# Patient Record
Sex: Male | Born: 1950
Health system: Southern US, Community
[De-identification: ages and names within clinical notes are randomized; demographics above are authoritative.]

## PROBLEM LIST (undated history)

## (undated) DIAGNOSIS — I714 Abdominal aortic aneurysm, without rupture, unspecified: Secondary | ICD-10-CM

## (undated) HISTORY — PX: FOOT SURGERY: SHX648

## (undated) HISTORY — DX: Abdominal aortic aneurysm, without rupture: I71.4

## (undated) HISTORY — DX: Abdominal aortic aneurysm, without rupture, unspecified: I71.40

## (undated) HISTORY — PX: NOSE SURGERY: SHX723

## (undated) HISTORY — PX: KNEE SURGERY: SHX244

---

## 2012-03-20 DIAGNOSIS — I10 Essential (primary) hypertension: Secondary | ICD-10-CM

## 2012-03-20 DIAGNOSIS — E785 Hyperlipidemia, unspecified: Secondary | ICD-10-CM

## 2012-03-20 HISTORY — DX: Essential (primary) hypertension: I10

## 2012-03-20 HISTORY — DX: Hyperlipidemia, unspecified: E78.5

## 2016-03-29 DIAGNOSIS — Z7982 Long term (current) use of aspirin: Secondary | ICD-10-CM | POA: Diagnosis not present

## 2016-03-29 DIAGNOSIS — K219 Gastro-esophageal reflux disease without esophagitis: Secondary | ICD-10-CM | POA: Diagnosis not present

## 2016-03-29 DIAGNOSIS — M1990 Unspecified osteoarthritis, unspecified site: Secondary | ICD-10-CM | POA: Diagnosis not present

## 2016-03-29 DIAGNOSIS — K589 Irritable bowel syndrome without diarrhea: Secondary | ICD-10-CM | POA: Diagnosis not present

## 2016-03-29 DIAGNOSIS — K5790 Diverticulosis of intestine, part unspecified, without perforation or abscess without bleeding: Secondary | ICD-10-CM | POA: Diagnosis not present

## 2016-03-29 DIAGNOSIS — E538 Deficiency of other specified B group vitamins: Secondary | ICD-10-CM | POA: Diagnosis not present

## 2016-03-29 DIAGNOSIS — E559 Vitamin D deficiency, unspecified: Secondary | ICD-10-CM | POA: Diagnosis not present

## 2016-03-29 DIAGNOSIS — R1310 Dysphagia, unspecified: Secondary | ICD-10-CM | POA: Diagnosis not present

## 2016-03-29 DIAGNOSIS — N400 Enlarged prostate without lower urinary tract symptoms: Secondary | ICD-10-CM | POA: Diagnosis not present

## 2016-03-29 DIAGNOSIS — R1013 Epigastric pain: Secondary | ICD-10-CM | POA: Diagnosis not present

## 2016-03-29 DIAGNOSIS — M810 Age-related osteoporosis without current pathological fracture: Secondary | ICD-10-CM | POA: Diagnosis not present

## 2016-03-29 DIAGNOSIS — R109 Unspecified abdominal pain: Secondary | ICD-10-CM | POA: Diagnosis not present

## 2016-03-29 DIAGNOSIS — G8929 Other chronic pain: Secondary | ICD-10-CM | POA: Diagnosis not present

## 2016-03-29 DIAGNOSIS — Z961 Presence of intraocular lens: Secondary | ICD-10-CM | POA: Diagnosis not present

## 2016-03-29 DIAGNOSIS — F418 Other specified anxiety disorders: Secondary | ICD-10-CM | POA: Diagnosis not present

## 2016-03-29 DIAGNOSIS — Z79899 Other long term (current) drug therapy: Secondary | ICD-10-CM | POA: Diagnosis not present

## 2016-03-29 DIAGNOSIS — E785 Hyperlipidemia, unspecified: Secondary | ICD-10-CM | POA: Diagnosis not present

## 2016-03-29 DIAGNOSIS — Z880 Allergy status to penicillin: Secondary | ICD-10-CM | POA: Diagnosis not present

## 2016-04-12 DIAGNOSIS — E119 Type 2 diabetes mellitus without complications: Secondary | ICD-10-CM | POA: Diagnosis not present

## 2016-04-12 DIAGNOSIS — Z125 Encounter for screening for malignant neoplasm of prostate: Secondary | ICD-10-CM | POA: Diagnosis not present

## 2016-04-12 DIAGNOSIS — E7800 Pure hypercholesterolemia, unspecified: Secondary | ICD-10-CM | POA: Diagnosis not present

## 2016-04-12 DIAGNOSIS — K219 Gastro-esophageal reflux disease without esophagitis: Secondary | ICD-10-CM | POA: Diagnosis not present

## 2016-04-12 DIAGNOSIS — I10 Essential (primary) hypertension: Secondary | ICD-10-CM | POA: Diagnosis not present

## 2016-04-12 DIAGNOSIS — E663 Overweight: Secondary | ICD-10-CM | POA: Diagnosis not present

## 2016-04-12 DIAGNOSIS — N419 Inflammatory disease of prostate, unspecified: Secondary | ICD-10-CM | POA: Diagnosis not present

## 2016-04-12 DIAGNOSIS — M818 Other osteoporosis without current pathological fracture: Secondary | ICD-10-CM | POA: Diagnosis not present

## 2016-04-12 DIAGNOSIS — Z6825 Body mass index (BMI) 25.0-25.9, adult: Secondary | ICD-10-CM | POA: Diagnosis not present

## 2016-05-03 DIAGNOSIS — R05 Cough: Secondary | ICD-10-CM | POA: Diagnosis not present

## 2016-05-03 DIAGNOSIS — E7800 Pure hypercholesterolemia, unspecified: Secondary | ICD-10-CM | POA: Diagnosis not present

## 2016-05-03 DIAGNOSIS — R6889 Other general symptoms and signs: Secondary | ICD-10-CM | POA: Diagnosis not present

## 2016-05-03 DIAGNOSIS — Z6825 Body mass index (BMI) 25.0-25.9, adult: Secondary | ICD-10-CM | POA: Diagnosis not present

## 2016-05-03 DIAGNOSIS — J189 Pneumonia, unspecified organism: Secondary | ICD-10-CM | POA: Diagnosis not present

## 2016-05-03 DIAGNOSIS — J111 Influenza due to unidentified influenza virus with other respiratory manifestations: Secondary | ICD-10-CM | POA: Diagnosis not present

## 2016-05-03 DIAGNOSIS — I10 Essential (primary) hypertension: Secondary | ICD-10-CM | POA: Diagnosis not present

## 2016-05-03 DIAGNOSIS — R1013 Epigastric pain: Secondary | ICD-10-CM | POA: Diagnosis not present

## 2016-05-12 DIAGNOSIS — R109 Unspecified abdominal pain: Secondary | ICD-10-CM | POA: Diagnosis not present

## 2016-07-12 DIAGNOSIS — Z79899 Other long term (current) drug therapy: Secondary | ICD-10-CM | POA: Diagnosis not present

## 2016-07-12 DIAGNOSIS — E7800 Pure hypercholesterolemia, unspecified: Secondary | ICD-10-CM | POA: Diagnosis not present

## 2016-07-12 DIAGNOSIS — E119 Type 2 diabetes mellitus without complications: Secondary | ICD-10-CM | POA: Diagnosis not present

## 2016-07-12 DIAGNOSIS — K589 Irritable bowel syndrome without diarrhea: Secondary | ICD-10-CM | POA: Diagnosis not present

## 2016-07-12 DIAGNOSIS — K219 Gastro-esophageal reflux disease without esophagitis: Secondary | ICD-10-CM | POA: Diagnosis not present

## 2016-07-12 DIAGNOSIS — I10 Essential (primary) hypertension: Secondary | ICD-10-CM | POA: Diagnosis not present

## 2016-07-12 DIAGNOSIS — I714 Abdominal aortic aneurysm, without rupture: Secondary | ICD-10-CM | POA: Diagnosis not present

## 2016-07-12 DIAGNOSIS — E559 Vitamin D deficiency, unspecified: Secondary | ICD-10-CM | POA: Diagnosis not present

## 2016-07-12 DIAGNOSIS — Z6825 Body mass index (BMI) 25.0-25.9, adult: Secondary | ICD-10-CM | POA: Diagnosis not present

## 2016-10-11 DIAGNOSIS — I10 Essential (primary) hypertension: Secondary | ICD-10-CM | POA: Diagnosis not present

## 2016-10-11 DIAGNOSIS — K219 Gastro-esophageal reflux disease without esophagitis: Secondary | ICD-10-CM | POA: Diagnosis not present

## 2016-10-11 DIAGNOSIS — I714 Abdominal aortic aneurysm, without rupture: Secondary | ICD-10-CM | POA: Diagnosis not present

## 2016-10-11 DIAGNOSIS — E119 Type 2 diabetes mellitus without complications: Secondary | ICD-10-CM | POA: Diagnosis not present

## 2016-10-11 DIAGNOSIS — H6122 Impacted cerumen, left ear: Secondary | ICD-10-CM | POA: Diagnosis not present

## 2016-10-11 DIAGNOSIS — K589 Irritable bowel syndrome without diarrhea: Secondary | ICD-10-CM | POA: Diagnosis not present

## 2016-10-11 DIAGNOSIS — E7800 Pure hypercholesterolemia, unspecified: Secondary | ICD-10-CM | POA: Diagnosis not present

## 2016-10-11 DIAGNOSIS — Z79899 Other long term (current) drug therapy: Secondary | ICD-10-CM | POA: Diagnosis not present

## 2016-10-11 DIAGNOSIS — E559 Vitamin D deficiency, unspecified: Secondary | ICD-10-CM | POA: Diagnosis not present

## 2016-10-11 DIAGNOSIS — Z6825 Body mass index (BMI) 25.0-25.9, adult: Secondary | ICD-10-CM | POA: Diagnosis not present

## 2017-01-12 DIAGNOSIS — K589 Irritable bowel syndrome without diarrhea: Secondary | ICD-10-CM | POA: Diagnosis not present

## 2017-01-12 DIAGNOSIS — I10 Essential (primary) hypertension: Secondary | ICD-10-CM | POA: Diagnosis not present

## 2017-01-12 DIAGNOSIS — Z79899 Other long term (current) drug therapy: Secondary | ICD-10-CM | POA: Diagnosis not present

## 2017-01-12 DIAGNOSIS — E7800 Pure hypercholesterolemia, unspecified: Secondary | ICD-10-CM | POA: Diagnosis not present

## 2017-01-12 DIAGNOSIS — K219 Gastro-esophageal reflux disease without esophagitis: Secondary | ICD-10-CM | POA: Diagnosis not present

## 2017-01-12 DIAGNOSIS — Z9181 History of falling: Secondary | ICD-10-CM | POA: Diagnosis not present

## 2017-01-12 DIAGNOSIS — E119 Type 2 diabetes mellitus without complications: Secondary | ICD-10-CM | POA: Diagnosis not present

## 2017-01-12 DIAGNOSIS — I714 Abdominal aortic aneurysm, without rupture: Secondary | ICD-10-CM | POA: Diagnosis not present

## 2017-01-12 DIAGNOSIS — E559 Vitamin D deficiency, unspecified: Secondary | ICD-10-CM | POA: Diagnosis not present

## 2017-01-12 DIAGNOSIS — J309 Allergic rhinitis, unspecified: Secondary | ICD-10-CM | POA: Diagnosis not present

## 2017-01-12 DIAGNOSIS — Z1389 Encounter for screening for other disorder: Secondary | ICD-10-CM | POA: Diagnosis not present

## 2017-03-20 DIAGNOSIS — H9190 Unspecified hearing loss, unspecified ear: Secondary | ICD-10-CM

## 2017-03-20 HISTORY — DX: Unspecified hearing loss, unspecified ear: H91.90

## 2017-03-21 DIAGNOSIS — M5442 Lumbago with sciatica, left side: Secondary | ICD-10-CM | POA: Diagnosis not present

## 2017-04-16 DIAGNOSIS — I714 Abdominal aortic aneurysm, without rupture: Secondary | ICD-10-CM | POA: Diagnosis not present

## 2017-04-16 DIAGNOSIS — K589 Irritable bowel syndrome without diarrhea: Secondary | ICD-10-CM | POA: Diagnosis not present

## 2017-04-16 DIAGNOSIS — Z6825 Body mass index (BMI) 25.0-25.9, adult: Secondary | ICD-10-CM | POA: Diagnosis not present

## 2017-04-16 DIAGNOSIS — K219 Gastro-esophageal reflux disease without esophagitis: Secondary | ICD-10-CM | POA: Diagnosis not present

## 2017-04-16 DIAGNOSIS — E7800 Pure hypercholesterolemia, unspecified: Secondary | ICD-10-CM | POA: Diagnosis not present

## 2017-04-16 DIAGNOSIS — Z125 Encounter for screening for malignant neoplasm of prostate: Secondary | ICD-10-CM | POA: Diagnosis not present

## 2017-04-16 DIAGNOSIS — Z79899 Other long term (current) drug therapy: Secondary | ICD-10-CM | POA: Diagnosis not present

## 2017-04-16 DIAGNOSIS — E559 Vitamin D deficiency, unspecified: Secondary | ICD-10-CM | POA: Diagnosis not present

## 2017-04-16 DIAGNOSIS — I10 Essential (primary) hypertension: Secondary | ICD-10-CM | POA: Diagnosis not present

## 2017-04-16 DIAGNOSIS — E119 Type 2 diabetes mellitus without complications: Secondary | ICD-10-CM | POA: Diagnosis not present

## 2017-05-14 DIAGNOSIS — I714 Abdominal aortic aneurysm, without rupture: Secondary | ICD-10-CM | POA: Diagnosis not present

## 2017-07-18 DIAGNOSIS — K589 Irritable bowel syndrome without diarrhea: Secondary | ICD-10-CM | POA: Diagnosis not present

## 2017-07-18 DIAGNOSIS — E7800 Pure hypercholesterolemia, unspecified: Secondary | ICD-10-CM | POA: Diagnosis not present

## 2017-07-18 DIAGNOSIS — K219 Gastro-esophageal reflux disease without esophagitis: Secondary | ICD-10-CM | POA: Diagnosis not present

## 2017-07-18 DIAGNOSIS — I10 Essential (primary) hypertension: Secondary | ICD-10-CM | POA: Diagnosis not present

## 2017-07-18 DIAGNOSIS — Z6825 Body mass index (BMI) 25.0-25.9, adult: Secondary | ICD-10-CM | POA: Diagnosis not present

## 2017-07-18 DIAGNOSIS — E559 Vitamin D deficiency, unspecified: Secondary | ICD-10-CM | POA: Diagnosis not present

## 2017-07-18 DIAGNOSIS — I714 Abdominal aortic aneurysm, without rupture: Secondary | ICD-10-CM | POA: Diagnosis not present

## 2017-07-18 DIAGNOSIS — R739 Hyperglycemia, unspecified: Secondary | ICD-10-CM | POA: Diagnosis not present

## 2017-10-18 DIAGNOSIS — E559 Vitamin D deficiency, unspecified: Secondary | ICD-10-CM | POA: Diagnosis not present

## 2017-10-18 DIAGNOSIS — J309 Allergic rhinitis, unspecified: Secondary | ICD-10-CM | POA: Diagnosis not present

## 2017-10-18 DIAGNOSIS — Z1339 Encounter for screening examination for other mental health and behavioral disorders: Secondary | ICD-10-CM | POA: Diagnosis not present

## 2017-10-18 DIAGNOSIS — K219 Gastro-esophageal reflux disease without esophagitis: Secondary | ICD-10-CM | POA: Diagnosis not present

## 2017-10-18 DIAGNOSIS — I714 Abdominal aortic aneurysm, without rupture: Secondary | ICD-10-CM | POA: Diagnosis not present

## 2017-10-18 DIAGNOSIS — E118 Type 2 diabetes mellitus with unspecified complications: Secondary | ICD-10-CM | POA: Diagnosis not present

## 2017-10-18 DIAGNOSIS — E538 Deficiency of other specified B group vitamins: Secondary | ICD-10-CM | POA: Diagnosis not present

## 2017-10-18 DIAGNOSIS — Z6825 Body mass index (BMI) 25.0-25.9, adult: Secondary | ICD-10-CM | POA: Diagnosis not present

## 2017-10-18 DIAGNOSIS — K589 Irritable bowel syndrome without diarrhea: Secondary | ICD-10-CM | POA: Diagnosis not present

## 2017-10-18 DIAGNOSIS — E7800 Pure hypercholesterolemia, unspecified: Secondary | ICD-10-CM | POA: Diagnosis not present

## 2017-10-18 DIAGNOSIS — I10 Essential (primary) hypertension: Secondary | ICD-10-CM | POA: Diagnosis not present

## 2017-10-18 DIAGNOSIS — Z789 Other specified health status: Secondary | ICD-10-CM | POA: Diagnosis not present

## 2017-11-12 DIAGNOSIS — I714 Abdominal aortic aneurysm, without rupture: Secondary | ICD-10-CM | POA: Diagnosis not present

## 2017-11-22 DIAGNOSIS — M6702 Short Achilles tendon (acquired), left ankle: Secondary | ICD-10-CM | POA: Diagnosis not present

## 2017-11-22 DIAGNOSIS — M722 Plantar fascial fibromatosis: Secondary | ICD-10-CM | POA: Diagnosis not present

## 2017-12-11 DIAGNOSIS — M722 Plantar fascial fibromatosis: Secondary | ICD-10-CM | POA: Diagnosis not present

## 2017-12-11 DIAGNOSIS — M6702 Short Achilles tendon (acquired), left ankle: Secondary | ICD-10-CM | POA: Diagnosis not present

## 2018-01-21 DIAGNOSIS — E7800 Pure hypercholesterolemia, unspecified: Secondary | ICD-10-CM | POA: Diagnosis not present

## 2018-01-21 DIAGNOSIS — Z9181 History of falling: Secondary | ICD-10-CM | POA: Diagnosis not present

## 2018-01-21 DIAGNOSIS — I714 Abdominal aortic aneurysm, without rupture: Secondary | ICD-10-CM | POA: Diagnosis not present

## 2018-01-21 DIAGNOSIS — K599 Functional intestinal disorder, unspecified: Secondary | ICD-10-CM | POA: Diagnosis not present

## 2018-01-21 DIAGNOSIS — J309 Allergic rhinitis, unspecified: Secondary | ICD-10-CM | POA: Diagnosis not present

## 2018-01-21 DIAGNOSIS — E118 Type 2 diabetes mellitus with unspecified complications: Secondary | ICD-10-CM | POA: Diagnosis not present

## 2018-01-21 DIAGNOSIS — K219 Gastro-esophageal reflux disease without esophagitis: Secondary | ICD-10-CM | POA: Diagnosis not present

## 2018-01-21 DIAGNOSIS — I10 Essential (primary) hypertension: Secondary | ICD-10-CM | POA: Diagnosis not present

## 2018-01-21 DIAGNOSIS — Z1331 Encounter for screening for depression: Secondary | ICD-10-CM | POA: Diagnosis not present

## 2018-04-23 DIAGNOSIS — E559 Vitamin D deficiency, unspecified: Secondary | ICD-10-CM | POA: Diagnosis not present

## 2018-04-23 DIAGNOSIS — I10 Essential (primary) hypertension: Secondary | ICD-10-CM | POA: Diagnosis not present

## 2018-04-23 DIAGNOSIS — I714 Abdominal aortic aneurysm, without rupture: Secondary | ICD-10-CM | POA: Diagnosis not present

## 2018-04-23 DIAGNOSIS — R1013 Epigastric pain: Secondary | ICD-10-CM | POA: Diagnosis not present

## 2018-04-23 DIAGNOSIS — K219 Gastro-esophageal reflux disease without esophagitis: Secondary | ICD-10-CM | POA: Diagnosis not present

## 2018-04-23 DIAGNOSIS — K589 Irritable bowel syndrome without diarrhea: Secondary | ICD-10-CM | POA: Diagnosis not present

## 2018-04-23 DIAGNOSIS — N400 Enlarged prostate without lower urinary tract symptoms: Secondary | ICD-10-CM | POA: Diagnosis not present

## 2018-04-23 DIAGNOSIS — Z125 Encounter for screening for malignant neoplasm of prostate: Secondary | ICD-10-CM | POA: Diagnosis not present

## 2018-04-23 DIAGNOSIS — E118 Type 2 diabetes mellitus with unspecified complications: Secondary | ICD-10-CM | POA: Diagnosis not present

## 2018-04-23 DIAGNOSIS — E7800 Pure hypercholesterolemia, unspecified: Secondary | ICD-10-CM | POA: Diagnosis not present

## 2018-06-25 DIAGNOSIS — K219 Gastro-esophageal reflux disease without esophagitis: Secondary | ICD-10-CM | POA: Diagnosis not present

## 2018-06-25 DIAGNOSIS — M6281 Muscle weakness (generalized): Secondary | ICD-10-CM | POA: Diagnosis not present

## 2018-06-25 DIAGNOSIS — R0781 Pleurodynia: Secondary | ICD-10-CM | POA: Diagnosis not present

## 2018-06-25 DIAGNOSIS — R1011 Right upper quadrant pain: Secondary | ICD-10-CM | POA: Diagnosis not present

## 2018-06-25 DIAGNOSIS — M47816 Spondylosis without myelopathy or radiculopathy, lumbar region: Secondary | ICD-10-CM | POA: Diagnosis not present

## 2018-06-25 DIAGNOSIS — C61 Malignant neoplasm of prostate: Secondary | ICD-10-CM | POA: Diagnosis not present

## 2018-06-25 DIAGNOSIS — Z6825 Body mass index (BMI) 25.0-25.9, adult: Secondary | ICD-10-CM | POA: Diagnosis not present

## 2018-06-25 DIAGNOSIS — M2560 Stiffness of unspecified joint, not elsewhere classified: Secondary | ICD-10-CM | POA: Diagnosis not present

## 2018-06-25 DIAGNOSIS — R262 Difficulty in walking, not elsewhere classified: Secondary | ICD-10-CM | POA: Diagnosis not present

## 2018-06-25 DIAGNOSIS — I69354 Hemiplegia and hemiparesis following cerebral infarction affecting left non-dominant side: Secondary | ICD-10-CM | POA: Diagnosis not present

## 2018-06-25 DIAGNOSIS — H353132 Nonexudative age-related macular degeneration, bilateral, intermediate dry stage: Secondary | ICD-10-CM | POA: Diagnosis not present

## 2018-06-26 DIAGNOSIS — R1011 Right upper quadrant pain: Secondary | ICD-10-CM | POA: Diagnosis not present

## 2018-06-26 DIAGNOSIS — R079 Chest pain, unspecified: Secondary | ICD-10-CM | POA: Diagnosis not present

## 2018-06-26 DIAGNOSIS — M25572 Pain in left ankle and joints of left foot: Secondary | ICD-10-CM | POA: Diagnosis not present

## 2018-06-26 DIAGNOSIS — G8929 Other chronic pain: Secondary | ICD-10-CM | POA: Diagnosis not present

## 2018-07-01 DIAGNOSIS — R197 Diarrhea, unspecified: Secondary | ICD-10-CM | POA: Diagnosis not present

## 2018-07-02 DIAGNOSIS — R079 Chest pain, unspecified: Secondary | ICD-10-CM | POA: Diagnosis not present

## 2018-07-02 DIAGNOSIS — K219 Gastro-esophageal reflux disease without esophagitis: Secondary | ICD-10-CM | POA: Diagnosis not present

## 2018-07-02 DIAGNOSIS — R1011 Right upper quadrant pain: Secondary | ICD-10-CM | POA: Diagnosis not present

## 2018-07-03 DIAGNOSIS — R0789 Other chest pain: Secondary | ICD-10-CM | POA: Diagnosis not present

## 2018-07-03 DIAGNOSIS — R079 Chest pain, unspecified: Secondary | ICD-10-CM | POA: Diagnosis not present

## 2018-07-04 ENCOUNTER — Emergency Department (HOSPITAL_COMMUNITY): Payer: PPO

## 2018-07-04 ENCOUNTER — Other Ambulatory Visit: Payer: Self-pay

## 2018-07-04 ENCOUNTER — Emergency Department (HOSPITAL_COMMUNITY)
Admission: EM | Admit: 2018-07-04 | Discharge: 2018-07-04 | Disposition: A | Discharge status: Home / Self Care | Payer: PPO | Attending: Emergency Medicine | Admitting: Emergency Medicine

## 2018-07-04 ENCOUNTER — Encounter: Payer: Self-pay | Admitting: Cardiology

## 2018-07-04 DIAGNOSIS — R0789 Other chest pain: Secondary | ICD-10-CM | POA: Diagnosis not present

## 2018-07-04 DIAGNOSIS — I10 Essential (primary) hypertension: Secondary | ICD-10-CM | POA: Diagnosis not present

## 2018-07-04 DIAGNOSIS — R079 Chest pain, unspecified: Secondary | ICD-10-CM | POA: Diagnosis not present

## 2018-07-04 DIAGNOSIS — I2510 Atherosclerotic heart disease of native coronary artery without angina pectoris: Secondary | ICD-10-CM | POA: Insufficient documentation

## 2018-07-04 DIAGNOSIS — E119 Type 2 diabetes mellitus without complications: Secondary | ICD-10-CM | POA: Insufficient documentation

## 2018-07-04 DIAGNOSIS — K810 Acute cholecystitis: Secondary | ICD-10-CM | POA: Diagnosis not present

## 2018-07-04 DIAGNOSIS — R9439 Abnormal result of other cardiovascular function study: Secondary | ICD-10-CM | POA: Insufficient documentation

## 2018-07-04 DIAGNOSIS — R1011 Right upper quadrant pain: Secondary | ICD-10-CM | POA: Diagnosis not present

## 2018-07-04 DIAGNOSIS — R011 Cardiac murmur, unspecified: Secondary | ICD-10-CM | POA: Insufficient documentation

## 2018-07-04 DIAGNOSIS — I714 Abdominal aortic aneurysm, without rupture: Secondary | ICD-10-CM | POA: Diagnosis not present

## 2018-07-04 DIAGNOSIS — E785 Hyperlipidemia, unspecified: Secondary | ICD-10-CM | POA: Insufficient documentation

## 2018-07-04 LAB — CBC WITH DIFFERENTIAL/PLATELET
Abs Immature Granulocytes: 0.02 10*3/uL (ref 0.00–0.07)
Basophils Absolute: 0 10*3/uL (ref 0.0–0.1)
Basophils Relative: 0 %
Eosinophils Absolute: 0 10*3/uL (ref 0.0–0.5)
Eosinophils Relative: 0 %
HCT: 45 % (ref 39.0–52.0)
Hemoglobin: 14.8 g/dL (ref 13.0–17.0)
Immature Granulocytes: 0 %
Lymphocytes Relative: 12 %
Lymphs Abs: 1.2 10*3/uL (ref 0.7–4.0)
MCH: 30.3 pg (ref 26.0–34.0)
MCHC: 32.9 g/dL (ref 30.0–36.0)
MCV: 92 fL (ref 80.0–100.0)
Monocytes Absolute: 1 10*3/uL (ref 0.1–1.0)
Monocytes Relative: 10 %
Neutro Abs: 8 10*3/uL — ABNORMAL HIGH (ref 1.7–7.7)
Neutrophils Relative %: 78 %
Platelets: 175 10*3/uL (ref 150–400)
RBC: 4.89 MIL/uL (ref 4.22–5.81)
RDW: 12.9 % (ref 11.5–15.5)
WBC: 10.1 10*3/uL (ref 4.0–10.5)
nRBC: 0 % (ref 0.0–0.2)

## 2018-07-04 LAB — I-STAT TROPONIN, ED: Troponin i, poc: 0.01 ng/mL (ref 0.00–0.08)

## 2018-07-04 LAB — COMPREHENSIVE METABOLIC PANEL
ALT: 34 U/L (ref 0–44)
AST: 35 U/L (ref 15–41)
Albumin: 4.5 g/dL (ref 3.5–5.0)
Alkaline Phosphatase: 45 U/L (ref 38–126)
Anion gap: 11 (ref 5–15)
BUN: <5 mg/dL — ABNORMAL LOW (ref 8–23)
CO2: 22 mmol/L (ref 22–32)
Calcium: 9.4 mg/dL (ref 8.9–10.3)
Chloride: 101 mmol/L (ref 98–111)
Creatinine, Ser: 0.91 mg/dL (ref 0.61–1.24)
GFR calc Af Amer: >60 mL/min (ref >60)
GFR calc non Af Amer: >60 mL/min (ref >60)
Glucose, Bld: 124 mg/dL — ABNORMAL HIGH (ref 70–99)
Potassium: 3.4 mmol/L — ABNORMAL LOW (ref 3.5–5.1)
Sodium: 134 mmol/L — ABNORMAL LOW (ref 135–145)
Total Bilirubin: 0.8 mg/dL (ref 0.3–1.2)
Total Protein: 7 g/dL (ref 6.5–8.1)

## 2018-07-04 LAB — LIPASE, BLOOD: Lipase: 27 U/L (ref 11–51)

## 2018-07-04 LAB — D-DIMER, QUANTITATIVE: D-Dimer, Quant: 0.7 ug/mL-FEU — ABNORMAL HIGH (ref 0.00–0.50)

## 2018-07-04 MED ORDER — ONDANSETRON HCL 4 MG/2ML IJ SOLN
4.0000 mg | Freq: Once | INTRAMUSCULAR | Status: AC
  Administered 2018-07-04: 18:00:00 4 mg via INTRAVENOUS
  Filled 2018-07-04: qty 2

## 2018-07-04 MED ORDER — ONDANSETRON HCL 4 MG/2ML IJ SOLN
4.0000 mg | Freq: Once | INTRAMUSCULAR | Status: AC
  Administered 2018-07-04: 4 mg via INTRAVENOUS
  Filled 2018-07-04: qty 2

## 2018-07-04 MED ORDER — MORPHINE SULFATE (PF) 4 MG/ML IV SOLN
4.0000 mg | Freq: Once | INTRAVENOUS | Status: AC
  Administered 2018-07-04: 4 mg via INTRAVENOUS
  Filled 2018-07-04: qty 1

## 2018-07-04 MED ORDER — HYDROCODONE-ACETAMINOPHEN 5-325 MG PO TABS: 1.0000 | ORAL_TABLET | Freq: Four times a day (QID) | ORAL | 0 refills | Status: DC | PRN

## 2018-07-04 MED ORDER — ONDANSETRON HCL 4 MG PO TABS: 4.0000 mg | ORAL_TABLET | Freq: Four times a day (QID) | ORAL | 0 refills | Status: DC

## 2018-07-04 MED ORDER — IOHEXOL 350 MG/ML SOLN
100.0000 mL | Freq: Once | INTRAVENOUS | Status: AC | PRN
  Administered 2018-07-04: 100 mL via INTRAVENOUS

## 2018-07-04 NOTE — ED Triage Notes (Signed)
Pt has been having right side chest pain for several weeks. Pt has had [place], CT, and cardiac stress test. Pt reports pain has gotten progressively worse. Skin warm and dry on arrival but reports severe pain in the esophagus and right side.

## 2018-07-04 NOTE — ED Provider Notes (Signed)
[facility] EMERGENCY DEPARTMENT Provider Note   CSN: [id number] Arrival date & time: 07/04/18  1709    History   Chief Complaint Chief Complaint  Patient presents with   Chest Pain    HPI Jeffery Payne is a 69 y.o. male.     68 year old male with prior medical history as detailed below presents for evaluation of persistent right-sided chest pain.  Patient with persistent right-sided chest pain for the last several weeks.  Patient is seen multiple providers for the same complaint.  He has seen both his regular care provider, general surgeon, and cardiology.  Testing thus far performed has included a cardiac stress test done yesterday.  Patient also had a CT of the chest.  Ultrasound was performed of the right upper quadrant.  Patient with recent evaluation by general surgery for possible gallbladder pathology.  Patient's work-up thus far has been fairly unremarkable.  However, general surgery did offer elective cholecystectomy if his symptoms persisted.  Patient reports that he has been taking Maalox and other GERD type medications at home with minimal relief of symptoms.  He describes a sharp pain that radiates vertically from the anterior midclavicular line down to the right lower quadrant of his abdomen.   The history is provided by the patient and medical records.  Chest Pain  Pain location:  R chest and R lateral chest Pain quality: aching and burning   Pain radiates to:  Does not radiate Pain severity:  Mild Onset quality:  Gradual Duration:  4 weeks Timing:  Constant Progression:  Waxing and waning Chronicity:  Recurrent Context: eating   Relieved by:  Nothing Worsened by:  Nothing Ineffective treatments:  Antacids   Past Medical History:  Diagnosis Date   Abdominal aortic aneurysm (AAA) ([place])     Patient Active Problem List   Diagnosis Date Noted   Ejection murmur 07/04/2018   Abnormal myocardial perfusion study 07/04/2018   Chest  pain in adult 07/04/2018   Coronary artery calcification seen on CAT scan 07/04/2018   Essential hypertension 07/04/2018   Hyperlipidemia 07/04/2018   Type 2 diabetes mellitus without complication, without long-term current use of insulin ([place]) 07/04/2018         Home Medications    Prior to Admission medications   Not on File    Family History No family history on file.  Social History Social History   Tobacco Use   Smoking status: Not on file  Substance Use Topics   Alcohol use: Not on file   Drug use: Not on file     Allergies   Patient has no allergy information on record.   Review of Systems Review of Systems  Cardiovascular: Positive for chest pain.  All other systems reviewed and are negative.    Physical Exam Updated Vital Signs BP (!) 182/96    Pulse 80    Temp 98 F (36.7 C) (Oral)    Resp 17    SpO2 97%   Physical Exam Vitals signs and nursing note reviewed.  Constitutional:      General: He is not in acute distress.    Appearance: He is well-developed.  HENT:     Head: Normocephalic and atraumatic.  Eyes:     Conjunctiva/sclera: Conjunctivae normal.     Pupils: Pupils are equal, round, and reactive to light.  Neck:     Musculoskeletal: Normal range of motion and neck supple.  Cardiovascular:     Rate and Rhythm: Normal rate and  regular rhythm.     Heart sounds: Normal heart sounds.  Pulmonary:     Effort: Pulmonary effort is normal. No respiratory distress.     Breath sounds: Normal breath sounds.  Abdominal:     General: There is no distension.     Palpations: Abdomen is soft.     Tenderness: There is no abdominal tenderness.  Musculoskeletal: Normal range of motion.        General: No deformity.  Skin:    General: Skin is warm and dry.  Neurological:     General: No focal deficit present.     Mental Status: He is alert and oriented to person, place, and time.     Cranial Nerves: No cranial nerve deficit.     Motor: No  weakness.      ED Treatments / Results  Labs (all labs ordered are listed, but only abnormal results are displayed) Labs Reviewed  COMPREHENSIVE METABOLIC PANEL - Abnormal; Notable for the following components:      Result Value   Sodium 134 (*)    Potassium 3.4 (*)    Glucose, Bld 124 (*)    BUN <5 (*)    All other components within normal limits  CBC WITH DIFFERENTIAL/PLATELET - Abnormal; Notable for the following components:   Neutro Abs 8.0 (*)    All other components within normal limits  D-DIMER, QUANTITATIVE (NOT AT [facility]) - Abnormal; Notable for the following components:   D-Dimer, Quant 0.70 (*)    All other components within normal limits  LIPASE, BLOOD  I-STAT TROPONIN, ED    EKG EKG Interpretation  Date/Time:  Thursday July 04 2018 18:27:15 EDT Ventricular Rate:  70 PR Interval:    QRS Duration: 101 QT Interval:  421 QTC Calculation: 455 R Axis:   61 Text Interpretation:  Sinus rhythm Probable left ventricular hypertrophy Confirmed by Dene Gentry [phone]) on 07/04/2018 6:29:08 PM   Radiology Ct Angio Chest Pe W And/or Wo Contrast  Result Date: 07/04/2018 CLINICAL DATA:  Right-sided chest pain for several weeks. Right upper quadrant pain. Cholecystitis EXAM: CT ANGIOGRAPHY CHEST CT ABDOMEN AND PELVIS WITH CONTRAST TECHNIQUE: Multidetector CT imaging of the chest was performed using the standard protocol during bolus administration of intravenous contrast. Multiplanar CT image reconstructions and MIPs were obtained to evaluate the vascular anatomy. Multidetector CT imaging of the abdomen and pelvis was performed using the standard protocol during bolus administration of intravenous contrast. CONTRAST:  165mL OMNIPAQUE IOHEXOL 350 MG/ML SOLN COMPARISON:  CT a chest 07/04/2018. Abdomen and pelvis CT 05/12/2016. FINDINGS: CTA CHEST FINDINGS Cardiovascular: The heart size is normal. No substantial pericardial effusion. Coronary artery calcification is evident.  Atherosclerotic calcification is noted in the wall of the thoracic aorta. No filling defect within the opacified pulmonary arteries to suggest the presence of an acute pulmonary embolus. Mediastinum/Nodes: No mediastinal lymphadenopathy. There is no hilar lymphadenopathy. The esophagus has normal imaging features. There is no axillary lymphadenopathy. Lungs/Pleura: The central tracheobronchial airways are patent. Compressive atelectasis noted dependent lower lungs bilaterally. Calcified granuloma evident in the posterior right upper lobe. Left lower lobe atelectasis noted. Musculoskeletal: Bone windows reveal no worrisome lytic or sclerotic osseous lesions. Review of the MIP images confirms the above findings. CT ABDOMEN and PELVIS FINDINGS Hepatobiliary: No suspicious focal abnormality within the liver parenchyma. There is no evidence for gallstones, gallbladder wall thickening, or pericholecystic fluid. No intrahepatic or extrahepatic biliary dilation. Pancreas: No focal mass lesion. No dilatation of the main duct. No intraparenchymal cyst.  No peripancreatic edema. Spleen: No splenomegaly. No focal mass lesion. Adrenals/Urinary Tract: No adrenal nodule or mass. Kidneys unremarkable. No evidence for hydroureter. The urinary bladder appears normal for the degree of distention. Stomach/Bowel: Stomach is unremarkable. No gastric wall thickening. No evidence of outlet obstruction. Duodenum is normally positioned as is the ligament of Treitz. No small bowel wall thickening. No small bowel dilatation. The terminal ileum is normal. The appendix is not visualized, but there is no edema or inflammation in the region of the cecum. Left colonic diverticulosis noted with edema/inflammation around a thick walled diverticulum (75/12). Imaging features are compatible with sigmoid diverticulitis. No perforation or abscess Vascular/Lymphatic: There is abdominal aortic atherosclerosis. Infrarenal abdominal aorta measures up to 3.7  cm diameter. There is no gastrohepatic or hepatoduodenal ligament lymphadenopathy. No intraperitoneal or retroperitoneal lymphadenopathy. No pelvic sidewall lymphadenopathy. Reproductive: The prostate gland and seminal vesicles are unremarkable. Other: No intraperitoneal free fluid. Musculoskeletal: Asymmetric elevation of the left hemidiaphragm is stable. Small sclerotic focus left ischial tuberosity is stable and likely a bone island. Bone windows reveal no worrisome lytic or sclerotic osseous lesions. Review of the MIP images confirms the above findings. IMPRESSION: 1. No CT evidence for acute pulmonary embolus. No acute findings in the chest to explain the patient's history of pain. 2. Sigmoid diverticulitis without perforation or abscess. 3. Infrarenal abdominal aortic aneurysm measuring 3.7 cm maximum diameter. Recommend followup by ultrasound in 2 years. This recommendation follows ACR consensus guidelines: White Paper of the ACR Incidental Findings Committee II on Vascular Findings. J Am Coll Radiol 2013; 10:789-794. Aortic aneurysm NOS (ICD10-I71.9) 4.  Aortic Atherosclerois (ICD10-170.0) Electronically Signed   By: Misty Stanley M.D.   On: 07/04/2018 20:12   Ct Abdomen Pelvis W Contrast  Result Date: 07/04/2018 CLINICAL DATA:  Right-sided chest pain for several weeks. Right upper quadrant pain. Cholecystitis EXAM: CT ANGIOGRAPHY CHEST CT ABDOMEN AND PELVIS WITH CONTRAST TECHNIQUE: Multidetector CT imaging of the chest was performed using the standard protocol during bolus administration of intravenous contrast. Multiplanar CT image reconstructions and MIPs were obtained to evaluate the vascular anatomy. Multidetector CT imaging of the abdomen and pelvis was performed using the standard protocol during bolus administration of intravenous contrast. CONTRAST:  173mL OMNIPAQUE IOHEXOL 350 MG/ML SOLN COMPARISON:  CT a chest 07/04/2018. Abdomen and pelvis CT 05/12/2016. FINDINGS: CTA CHEST FINDINGS  Cardiovascular: The heart size is normal. No substantial pericardial effusion. Coronary artery calcification is evident. Atherosclerotic calcification is noted in the wall of the thoracic aorta. No filling defect within the opacified pulmonary arteries to suggest the presence of an acute pulmonary embolus. Mediastinum/Nodes: No mediastinal lymphadenopathy. There is no hilar lymphadenopathy. The esophagus has normal imaging features. There is no axillary lymphadenopathy. Lungs/Pleura: The central tracheobronchial airways are patent. Compressive atelectasis noted dependent lower lungs bilaterally. Calcified granuloma evident in the posterior right upper lobe. Left lower lobe atelectasis noted. Musculoskeletal: Bone windows reveal no worrisome lytic or sclerotic osseous lesions. Review of the MIP images confirms the above findings. CT ABDOMEN and PELVIS FINDINGS Hepatobiliary: No suspicious focal abnormality within the liver parenchyma. There is no evidence for gallstones, gallbladder wall thickening, or pericholecystic fluid. No intrahepatic or extrahepatic biliary dilation. Pancreas: No focal mass lesion. No dilatation of the main duct. No intraparenchymal cyst. No peripancreatic edema. Spleen: No splenomegaly. No focal mass lesion. Adrenals/Urinary Tract: No adrenal nodule or mass. Kidneys unremarkable. No evidence for hydroureter. The urinary bladder appears normal for the degree of distention. Stomach/Bowel: Stomach is unremarkable. No gastric wall  thickening. No evidence of outlet obstruction. Duodenum is normally positioned as is the ligament of Treitz. No small bowel wall thickening. No small bowel dilatation. The terminal ileum is normal. The appendix is not visualized, but there is no edema or inflammation in the region of the cecum. Left colonic diverticulosis noted with edema/inflammation around a thick walled diverticulum (75/12). Imaging features are compatible with sigmoid diverticulitis. No perforation  or abscess Vascular/Lymphatic: There is abdominal aortic atherosclerosis. Infrarenal abdominal aorta measures up to 3.7 cm diameter. There is no gastrohepatic or hepatoduodenal ligament lymphadenopathy. No intraperitoneal or retroperitoneal lymphadenopathy. No pelvic sidewall lymphadenopathy. Reproductive: The prostate gland and seminal vesicles are unremarkable. Other: No intraperitoneal free fluid. Musculoskeletal: Asymmetric elevation of the left hemidiaphragm is stable. Small sclerotic focus left ischial tuberosity is stable and likely a bone island. Bone windows reveal no worrisome lytic or sclerotic osseous lesions. Review of the MIP images confirms the above findings. IMPRESSION: 1. No CT evidence for acute pulmonary embolus. No acute findings in the chest to explain the patient's history of pain. 2. Sigmoid diverticulitis without perforation or abscess. 3. Infrarenal abdominal aortic aneurysm measuring 3.7 cm maximum diameter. Recommend followup by ultrasound in 2 years. This recommendation follows ACR consensus guidelines: White Paper of the ACR Incidental Findings Committee II on Vascular Findings. J Am Coll Radiol 2013; 10:789-794. Aortic aneurysm NOS (ICD10-I71.9) 4.  Aortic Atherosclerois (ICD10-170.0) Electronically Signed   By: Misty Stanley M.D.   On: 07/04/2018 20:12   Dg Chest Port 1 View  Result Date: 07/04/2018 CLINICAL DATA:  Right chest pain. [place]. EXAM: PORTABLE CHEST 1 VIEW COMPARISON:  Chest CT of 06/26/2018 FINDINGS: Moderate to marked left hemidiaphragm elevation. Patient rotated minimally right. Normal heart size. Atherosclerosis in the transverse aorta. No right and no definite left pleural effusion. Volume loss at the left lung base. IMPRESSION: 1. No acute cardiopulmonary disease. 2. Moderate to marked left hemidiaphragm elevation with adjacent left lung base volume loss. 3.  Aortic Atherosclerosis (ICD10-I70.0). Electronically Signed   By: Abigail Miyamoto M.D.   On: 07/04/2018  19:35    Procedures Procedures (including critical care time)  Medications Ordered in ED Medications  morphine 4 MG/ML injection 4 mg (has no administration in time range)  ondansetron (ZOFRAN) injection 4 mg (has no administration in time range)  ondansetron (ZOFRAN) injection 4 mg (4 mg Intravenous Given 07/04/18 1806)  morphine 4 MG/ML injection 4 mg (4 mg Intravenous Given 07/04/18 1806)  iohexol (OMNIPAQUE) 350 MG/ML injection 100 mL (100 mLs Intravenous Contrast Given 07/04/18 1912)     Initial Impression / Assessment and Plan / ED Course  I have reviewed the triage vital signs and the nursing notes.  Pertinent labs & imaging results that were available during my care of the patient were reviewed by me and considered in my medical decision making (see chart for details).        MDM  Screen complete  Patient is presenting for evaluation of reported right-sided chest discomfort.  This has been a chronic condition.  Patient's presentation is not typical for ACS.  EKG is without evidence of acute ischemia.  Troponin is negative.  Patient reportedly had a stress test done yesterday as an outpatient which was also negative.  Patient has had significant prior work-up for this complaint over the last 4 weeks.  Thus far the work-up as reported by the patient and as reported by chart review does not demonstrate significant pathology.  Work-up today includes baseline labs and CT imaging  of the chest abdomen pelvis.  Baseline labs are without significant abnormality other than mildly elevated d-dimer.  CT imaging of the chest did not reveal significant acute pathology.  Abdominal CT imaging did show a known infrarenal aortic aneurysm.  CT imaging did also demonstrate mild inflammation around a sigmoid diverticulum.  Patient has just recently completed a course of antibiotics for likely diverticulitis less than 1 week ago.  I suspect that the imaging shows resolving inflammation.  Patient is  without evidence of acute infection at this time.  Following his work-up the patient feels improved.  He desires discharge home.  Patient does understand the need for close follow-up.  Strict return precautions given and understood.   Final Clinical Impressions(s) / ED Diagnoses   Final diagnoses:  Atypical chest pain    ED Discharge Orders         Ordered    HYDROcodone-acetaminophen (NORCO/VICODIN) 5-325 MG tablet  Every 6 hours PRN     07/04/18 2136    ondansetron (ZOFRAN) 4 MG tablet  Every 6 hours     07/04/18 2136           Valarie Merino, MD 07/04/18 2141

## 2018-07-04 NOTE — ED Notes (Signed)
Reviewed d/c instructions with pt, who verbalized understanding and had no outstanding questions. [employer] & pt labels disposed of in shred bin. Pt departed in NAD, refused use of wheelchair.

## 2018-07-04 NOTE — ED Notes (Signed)
Pt remains in CT

## 2018-07-04 NOTE — Discharge Instructions (Signed)
Please return for any problem.  Follow-up with your regular care provider as instructed. °

## 2018-07-05 DIAGNOSIS — I714 Abdominal aortic aneurysm, without rupture, unspecified: Secondary | ICD-10-CM | POA: Insufficient documentation

## 2018-07-08 ENCOUNTER — Telehealth (INDEPENDENT_AMBULATORY_CARE_PROVIDER_SITE_OTHER): Payer: PPO | Admitting: Cardiology

## 2018-07-08 ENCOUNTER — Encounter: Payer: Self-pay | Admitting: Cardiology

## 2018-07-08 ENCOUNTER — Other Ambulatory Visit: Payer: Self-pay

## 2018-07-08 VITALS — BP 160/72 | Ht 68.0 in | Wt 169.0 lb

## 2018-07-08 DIAGNOSIS — E782 Mixed hyperlipidemia: Secondary | ICD-10-CM

## 2018-07-08 DIAGNOSIS — R079 Chest pain, unspecified: Secondary | ICD-10-CM | POA: Diagnosis not present

## 2018-07-08 DIAGNOSIS — I2510 Atherosclerotic heart disease of native coronary artery without angina pectoris: Secondary | ICD-10-CM

## 2018-07-08 DIAGNOSIS — I10 Essential (primary) hypertension: Secondary | ICD-10-CM

## 2018-07-08 DIAGNOSIS — R011 Cardiac murmur, unspecified: Secondary | ICD-10-CM

## 2018-07-08 DIAGNOSIS — E119 Type 2 diabetes mellitus without complications: Secondary | ICD-10-CM

## 2018-07-08 DIAGNOSIS — R9439 Abnormal result of other cardiovascular function study: Secondary | ICD-10-CM

## 2018-07-08 MED ORDER — METOPROLOL TARTRATE 25 MG PO TABS: 25.0000 mg | ORAL_TABLET | Freq: Two times a day (BID) | ORAL | 3 refills | Status: AC

## 2018-07-08 NOTE — Progress Notes (Signed)
Virtual Visit via Video Note   This visit type was conducted due to national recommendations for restrictions regarding the COVID-19 Pandemic (e.g. social distancing) in an effort to limit this patient's exposure and mitigate transmission in our community.  Due to his co-morbid illnesses, this patient is at least at moderate risk for complications without adequate follow up.  This format is felt to be most appropriate for this patient at this time.  All issues noted in this document were discussed and addressed.  A limited physical exam was performed with this format.  Please refer to the patient's chart for his consent to telehealth for [facility].   Evaluation Performed:  Follow-up visit  Date:  07/08/2018   ID:  Jeffery Payne, Jeffery Payne Jul 01, 1950, MRN [id number]  Patient Location: Home Provider Location: Home  PCP:  [name]., MD  Cardiologist:  Dr Bettina Gavia Electrophysiologist:  None   Chief Complaint:  Chest pain and abnormal CT chest and MPI  History of Present Illness:    Prior to his visit I had reviewed extensive records including notes from his primary care physician general surgery [facility] CTs myocardial perfusion study EKGs and emergency room records from [facility] visits 07/04/2018.  Jeffery Payne is a 68 y.o. male with hypertension, T2 DM, hyperlipidemia former smoker and PAD with AAA  Referred for evaluation of chest pain, extensive CAC on CT scan  and an abnormal MPI with ischemia and hypokinesia and the EF 71%.  He has recently undergone extensive evaluation for right-sided chest pain including ultrasound the gallbladder normal HIDA scan normal and surgery evaluation.  His d-dimer was elevated 0.75 mcg/mL and a CTA of the chest showed no evidence of pulmonary embolism but he had extensive coronary artery calcification.  Other testing included a normal CBC and a CMP.  EKG performed 06/25/2018 independently reviewed showed sinus rhythm and was normal.  To evaluate chest pain he had a myocardial perfusion study [facility] 07/03/2018 is performed pharmacologically with Lexiscan EKG and blood pressure response were normal and  there was no arrhythmia.  Ejection fraction was 71% with hypokinesia of the anterior septal apical region he had 2 reversible ischemic defects the first in the anterior septal region and the second in the basilar inferolateral segment study was judged intermediate risk.  The patient does not have symptoms concerning for COVID-19 infection (fever, chills, cough, or new shortness of breath).   For several weeks he has been having chest pain.  Is located in the right chest not substernal radiates through to the back lasts for hours at a time describes it as severe heartburn indigestion aching and has had multiple medical interactions he was seen with his PCP and even had a CPK checked that was normal he underwent CT scan of the chest CT scan of the abdomen was seen by surgery for all purposes it sounds like typical biliary colic but he has no pathology.  His symptoms are not exertional they can last for hours at a time and it is inconsistent whether or not he is relieved with rest.  He is a very high risk individual he has type 2 diabetes he has severe extensive coronary calcification has an abnormal myocardial perfusion study with 2 vessel ischemia.  His symptoms are quite atypical but he has objective parameters that indicate coronary artery disease and I think he needs undergo coronary angiography because of the symptoms are ischemic they clearly are severe class III and are tending towards  unstable.  He is seen in [facility] emergency room 07/04/2018 and his troponin was undetectable he had another CT scan of the chest performed and there was no pathology he has no pulmonary embolism.  I reviewed the risk benefits and options of coronary angiography and discussed means of revascularization with the patient and his wife he has no dye  allergy no contraindication to dual antiplatelet therapy and they agree to coronary angiography and they like to have it scheduled as soon as possible.  This visit started as a video but they are unable to enable the video link for any of the platforms if he cannot be examined by the interventional cardiologist physical exam prior to the procedure will need to bring him to the office for the doctor the day to do physical examination. Past Medical History:  Diagnosis Date   Abdominal aortic aneurysm (AAA) (HCC)    Past Surgical History:  Procedure Laterality Date   FOOT SURGERY     KNEE SURGERY     NOSE SURGERY       Current Meds  Medication Sig   acetaminophen (TYLENOL) 325 MG tablet Take 650 mg by mouth every 6 (six) hours as needed (for pain).   alum & mag hydroxide-simeth (MAALOX/MYLANTA) 200-200-20 MG/5ML suspension Take 30 mLs by mouth every 4 (four) hours as needed for indigestion or heartburn.   amLODipine (NORVASC) 10 MG tablet Take 10 mg by mouth at bedtime.   aspirin EC 81 MG tablet Take 81 mg by mouth at bedtime.   cholecalciferol (VITAMIN D3) 25 MCG (1000 UT) tablet Take 1,000 Units by mouth daily.   Cyanocobalamin (VITAMIN B-12) 2500 MCG SUBL Place 2,500 mcg under the tongue every evening.   dexlansoprazole (DEXILANT) 60 MG capsule Take 60 mg by mouth 2 (two) times daily.   dicyclomine (BENTYL) 10 MG capsule Take 10 mg by mouth 4 (four) times daily -  before meals and at bedtime.   ergocalciferol (VITAMIN D2) 1.25 MG (50000 UT) capsule Take 50,000 Units by mouth every Sunday.   HYDROcodone-acetaminophen (NORCO/VICODIN) 5-325 MG tablet Take 1 tablet by mouth every 6 (six) hours as needed.   losartan (COZAAR) 100 MG tablet Take 100 mg by mouth at bedtime.   ondansetron (ZOFRAN) 4 MG tablet Take 1 tablet (4 mg total) by mouth every 6 (six) hours.   pravastatin (PRAVACHOL) 40 MG tablet Take 40 mg by mouth at bedtime.      Allergies:   Ciprofloxacin; Flagyl  [metronidazole]; and Penicillin g   Social History   Tobacco Use   Smoking status: Former Smoker    Years: 20.00    Types: Cigarettes    Last attempt to quit: 1990    Years since quitting: 30.3   Smokeless tobacco: Former Systems developer    Types: Chew  Substance Use Topics   Alcohol use: Not Currently   Drug use: Not Currently     Family Hx: The patient's family history includes CAD in his father; Lung disease in his brother and mother; Stroke in his father.  ROS:   Please see the history of present illness.     All other systems reviewed and are negative.   Prior CV studies:   The following studies were reviewed today:  See HPI  Labs/Other Tests and Data Reviewed:   EKG 07/04/2018 sinus rhythm left atrial enlargement nonspecific ST abnormality. Personally reviewed  EKG: comparison EKG unchanged ECG 12-Lead (01/04/2016 3:53 PM EDT) ECG 12-Lead (01/04/2016 3:53 PM EDT)  Component Value  Ref Range Performed At Pathologist Signature  VENTRICULAR RATE 66 BPM MUSE   ATRIAL RATE 66 BPM MUSE   P-R INTERVAL 162 ms MUSE   QRS DURATION 98 ms MUSE   Q-T INTERVAL 426 ms MUSE   QTC CALCULATION(BAZETT) 446 ms MUSE   CALCULATED P AXIS 35 degrees MUSE   CALCULATED R AXIS 52 degrees MUSE   CALCULATED T AXIS 54 degrees MUSE   DIAGNOSIS Normal sinus rhythm Minimal voltage criteria for LVH, may be normal variant Borderline ECG No previous ECGs available Confirmed by Chuck Hint, [place] ([id number]) on 01/20/2016 3:03:48 PM        Recent Labs: 07/04/2018: ALT 34; BUN <5; Creatinine, Ser 0.91; Hemoglobin 14.8; Platelets 175; Potassium 3.4; Sodium 134   Recent Lipid Panel 04/23/2018 cholesterol 191 HDL 77 LDL 100  Wt Readings from Last 3 Encounters:  07/08/18 169 lb (76.7 kg)  07/04/18 165 lb (74.8 kg)     Objective:    Vital Signs:  BP (!) 160/72    Ht 5\' 8"  (1.727 m)    Wt 169 lb (76.7 kg)    BMI 25.70 kg/m    VITAL SIGNS:  reviewed  ASSESSMENT & PLAN:    1. Very atypical  chest pain certainly appears to be biliary in nature however he is a very high risk individual male age diabetes hypertension hyperlipidemia former smoker has evidence of extensive coronary artery calcification and multivessel ischemia on the myocardial perfusion study symptoms are disturbing class III tending towards unstable and advised to undergo coronary angiography options risk and benefits detailed consent obtained.  He will continue current treatment and I will place him on a beta-blocker.  He will continue calcium channel blocker aspirin and statin.  He understands he may well require revascularization. 2. Abnormal myocardial perfusion study combined with extensive coronary artery calcification implies multivessel CAD 3. Ejection murmur noted by a surgeon will need to have a pullback across his aortic valve and a left ventriculogram for assessment and may benefit from an echocardiogram if he can be done the same day as his heart catheterization 4. Continue statin for hyperlipidemia.  I doubt he has significant aortic stenosis without valvular calcification however he could have hypertrophic cardiomyopathy and left ventricular outflow tract obstruction. 5. Continue current antihypertensives 6. Stable diabetes managed by his PCP  COVID-19 Education: The signs and symptoms of COVID-19 were discussed with the patient and how to seek care for testing (follow up with PCP or arrange E-visit).  The importance of social distancing was discussed today.  Time:   Today, I have spent 45 minutes with the patient with telehealth technology discussing the above problems.     Medication Adjustments/Labs and Tests Ordered: Current medicines are reviewed at length with the patient today.  Concerns regarding medicines are outlined above.   Tests Ordered: No orders of the defined types were placed in this encounter.   Medication Changes: No orders of the defined types were placed in this  encounter.   Disposition:  Follow up prn  Signed, Shirlee More, MD  07/08/2018 3:01 PM    [place]

## 2018-07-08 NOTE — H&P (View-Only) (Signed)
Virtual Visit via Video Note   This visit type was conducted due to national recommendations for restrictions regarding the COVID-19 Pandemic (e.g. social distancing) in an effort to limit this patient's exposure and mitigate transmission in our community.  Due to his co-morbid illnesses, this patient is at least at moderate risk for complications without adequate follow up.  This format is felt to be most appropriate for this patient at this time.  All issues noted in this document were discussed and addressed.  A limited physical exam was performed with this format.  Please refer to the patient's chart for his consent to telehealth for [facility].   Evaluation Performed:  Follow-up visit  Date:  07/08/2018   ID:  Jeffery Payne, Jeffery Payne 12-Feb-1951, MRN [id number]  Patient Location: Home Provider Location: Home  PCP:  [name]., MD  Cardiologist:  Dr Bettina Gavia Electrophysiologist:  None   Chief Complaint:  Chest pain and abnormal CT chest and MPI  History of Present Illness:    Prior to his visit I had reviewed extensive records including notes from his primary care physician general surgery [facility] CTs myocardial perfusion study EKGs and emergency room records from [facility] visits 07/04/2018.  Jeffery Payne is a 68 y.o. male with hypertension, T2 DM, hyperlipidemia former smoker and PAD with AAA  Referred for evaluation of chest pain, extensive CAC on CT scan  and an abnormal MPI with ischemia and hypokinesia and the EF 71%.  He has recently undergone extensive evaluation for right-sided chest pain including ultrasound the gallbladder normal HIDA scan normal and surgery evaluation.  His d-dimer was elevated 0.75 mcg/mL and a CTA of the chest showed no evidence of pulmonary embolism but he had extensive coronary artery calcification.  Other testing included a normal CBC and a CMP.  EKG performed 06/25/2018 independently reviewed showed sinus rhythm and was normal.  To evaluate chest pain he had a myocardial perfusion study [facility] 07/03/2018 is performed pharmacologically with Lexiscan EKG and blood pressure response were normal and  there was no arrhythmia.  Ejection fraction was 71% with hypokinesia of the anterior septal apical region he had 2 reversible ischemic defects the first in the anterior septal region and the second in the basilar inferolateral segment study was judged intermediate risk.  The patient does not have symptoms concerning for COVID-19 infection (fever, chills, cough, or new shortness of breath).   For several weeks he has been having chest pain.  Is located in the right chest not substernal radiates through to the back lasts for hours at a time describes it as severe heartburn indigestion aching and has had multiple medical interactions he was seen with his PCP and even had a CPK checked that was normal he underwent CT scan of the chest CT scan of the abdomen was seen by surgery for all purposes it sounds like typical biliary colic but he has no pathology.  His symptoms are not exertional they can last for hours at a time and it is inconsistent whether or not he is relieved with rest.  He is a very high risk individual he has type 2 diabetes he has severe extensive coronary calcification has an abnormal myocardial perfusion study with 2 vessel ischemia.  His symptoms are quite atypical but he has objective parameters that indicate coronary artery disease and I think he needs undergo coronary angiography because of the symptoms are ischemic they clearly are severe class III and are tending towards  unstable.  He is seen in [facility] emergency room 07/04/2018 and his troponin was undetectable he had another CT scan of the chest performed and there was no pathology he has no pulmonary embolism.  I reviewed the risk benefits and options of coronary angiography and discussed means of revascularization with the patient and his wife he has no dye  allergy no contraindication to dual antiplatelet therapy and they agree to coronary angiography and they like to have it scheduled as soon as possible.  This visit started as a video but they are unable to enable the video link for any of the platforms if he cannot be examined by the interventional cardiologist physical exam prior to the procedure will need to bring him to the office for the doctor the day to do physical examination. Past Medical History:  Diagnosis Date   Abdominal aortic aneurysm (AAA) (HCC)    Past Surgical History:  Procedure Laterality Date   FOOT SURGERY     KNEE SURGERY     NOSE SURGERY       Current Meds  Medication Sig   acetaminophen (TYLENOL) 325 MG tablet Take 650 mg by mouth every 6 (six) hours as needed (for pain).   alum & mag hydroxide-simeth (MAALOX/MYLANTA) 200-200-20 MG/5ML suspension Take 30 mLs by mouth every 4 (four) hours as needed for indigestion or heartburn.   amLODipine (NORVASC) 10 MG tablet Take 10 mg by mouth at bedtime.   aspirin EC 81 MG tablet Take 81 mg by mouth at bedtime.   cholecalciferol (VITAMIN D3) 25 MCG (1000 UT) tablet Take 1,000 Units by mouth daily.   Cyanocobalamin (VITAMIN B-12) 2500 MCG SUBL Place 2,500 mcg under the tongue every evening.   dexlansoprazole (DEXILANT) 60 MG capsule Take 60 mg by mouth 2 (two) times daily.   dicyclomine (BENTYL) 10 MG capsule Take 10 mg by mouth 4 (four) times daily -  before meals and at bedtime.   ergocalciferol (VITAMIN D2) 1.25 MG (50000 UT) capsule Take 50,000 Units by mouth every Sunday.   HYDROcodone-acetaminophen (NORCO/VICODIN) 5-325 MG tablet Take 1 tablet by mouth every 6 (six) hours as needed.   losartan (COZAAR) 100 MG tablet Take 100 mg by mouth at bedtime.   ondansetron (ZOFRAN) 4 MG tablet Take 1 tablet (4 mg total) by mouth every 6 (six) hours.   pravastatin (PRAVACHOL) 40 MG tablet Take 40 mg by mouth at bedtime.      Allergies:   Ciprofloxacin; Flagyl  [metronidazole]; and Penicillin g   Social History   Tobacco Use   Smoking status: Former Smoker    Years: 20.00    Types: Cigarettes    Last attempt to quit: 1990    Years since quitting: 30.3   Smokeless tobacco: Former Systems developer    Types: Chew  Substance Use Topics   Alcohol use: Not Currently   Drug use: Not Currently     Family Hx: The patient's family history includes CAD in his father; Lung disease in his brother and mother; Stroke in his father.  ROS:   Please see the history of present illness.     All other systems reviewed and are negative.   Prior CV studies:   The following studies were reviewed today:  See HPI  Labs/Other Tests and Data Reviewed:   EKG 07/04/2018 sinus rhythm left atrial enlargement nonspecific ST abnormality. Personally reviewed  EKG: comparison EKG unchanged ECG 12-Lead (01/04/2016 3:53 PM EDT) ECG 12-Lead (01/04/2016 3:53 PM EDT)  Component Value  Ref Range Performed At Pathologist Signature  VENTRICULAR RATE 66 BPM MUSE   ATRIAL RATE 66 BPM MUSE   P-R INTERVAL 162 ms MUSE   QRS DURATION 98 ms MUSE   Q-T INTERVAL 426 ms MUSE   QTC CALCULATION(BAZETT) 446 ms MUSE   CALCULATED P AXIS 35 degrees MUSE   CALCULATED R AXIS 52 degrees MUSE   CALCULATED T AXIS 54 degrees MUSE   DIAGNOSIS Normal sinus rhythm Minimal voltage criteria for LVH, may be normal variant Borderline ECG No previous ECGs available Confirmed by Chuck Hint, [place] ([id number]) on 01/20/2016 3:03:48 PM        Recent Labs: 07/04/2018: ALT 34; BUN <5; Creatinine, Ser 0.91; Hemoglobin 14.8; Platelets 175; Potassium 3.4; Sodium 134   Recent Lipid Panel 04/23/2018 cholesterol 191 HDL 77 LDL 100  Wt Readings from Last 3 Encounters:  07/08/18 169 lb (76.7 kg)  07/04/18 165 lb (74.8 kg)     Objective:    Vital Signs:  BP (!) 160/72    Ht 5\' 8"  (1.727 m)    Wt 169 lb (76.7 kg)    BMI 25.70 kg/m    VITAL SIGNS:  reviewed  ASSESSMENT & PLAN:    1. Very atypical  chest pain certainly appears to be biliary in nature however he is a very high risk individual male age diabetes hypertension hyperlipidemia former smoker has evidence of extensive coronary artery calcification and multivessel ischemia on the myocardial perfusion study symptoms are disturbing class III tending towards unstable and advised to undergo coronary angiography options risk and benefits detailed consent obtained.  He will continue current treatment and I will place him on a beta-blocker.  He will continue calcium channel blocker aspirin and statin.  He understands he may well require revascularization. 2. Abnormal myocardial perfusion study combined with extensive coronary artery calcification implies multivessel CAD 3. Ejection murmur noted by a surgeon will need to have a pullback across his aortic valve and a left ventriculogram for assessment and may benefit from an echocardiogram if he can be done the same day as his heart catheterization 4. Continue statin for hyperlipidemia.  I doubt he has significant aortic stenosis without valvular calcification however he could have hypertrophic cardiomyopathy and left ventricular outflow tract obstruction. 5. Continue current antihypertensives 6. Stable diabetes managed by his PCP  COVID-19 Education: The signs and symptoms of COVID-19 were discussed with the patient and how to seek care for testing (follow up with PCP or arrange E-visit).  The importance of social distancing was discussed today.  Time:   Today, I have spent 45 minutes with the patient with telehealth technology discussing the above problems.     Medication Adjustments/Labs and Tests Ordered: Current medicines are reviewed at length with the patient today.  Concerns regarding medicines are outlined above.   Tests Ordered: No orders of the defined types were placed in this encounter.   Medication Changes: No orders of the defined types were placed in this  encounter.   Disposition:  Follow up prn  Signed, Shirlee More, MD  07/08/2018 3:01 PM    [place]

## 2018-07-08 NOTE — Patient Instructions (Addendum)
Medication Instructions:   START metoprolol tartrate (lopressor) 25 mg: Take 1 tablet twice daily   If you need a refill on your cardiac medications before your next appointment, please call your pharmacy.   Lab work: None  If you have labs (blood work) drawn today and your tests are completely normal, you will receive your results only by: Marland Kitchen MyChart Message (if you have MyChart) OR . A paper copy in the mail If you have any lab test that is abnormal or we need to change your treatment, we will call you to review the results.  Testing/Procedures:    [place] [address] Dept: [phone] Loc: [place]  07/08/2018  You are scheduled for a Cardiac Catheterization on Wednesday, April 22 with Dr. Sherren Mocha.  1. Please arrive at the [facility] (Main Entrance A) at [facility]: [address] [place], [place] [address] at 6:30 AM (This time is two hours before your procedure to ensure your preparation). Free valet parking service is available.   Special note: Every effort is made to have your procedure done on time. Please understand that emergencies sometimes delay scheduled procedures.  2. Diet: Do not eat solid foods after midnight.  The patient may have clear liquids until 5am upon the day of the procedure.  3. Labs: None  4. Medication instructions in preparation for your procedure:   Contrast Allergy: No  Stop taking, Cozaar (Losartan) Tuesday, April 21,   On the morning of your procedure, take your Aspirin and any morning medicines NOT listed above.  You may use sips of water.  5. Plan for one night stay--bring personal belongings. 6. Bring a current list of your medications and current insurance cards. 7. You MUST have a responsible person to drive you home. 8. Someone MUST be with you the first 24 hours after you arrive home  or your discharge will be delayed. 9. Please wear clothes that are easy to get on and off and wear slip-on shoes.  Thank you for allowing [place] to care for you!   -- [place] [facility]   Follow-Up: At [facility], you and your health needs are our priority.  As part of our continuing mission to provide you with exceptional heart care, we have created designated Provider Care Teams.  These Care Teams include your primary Cardiologist (physician) and Advanced Practice Providers (APPs -  Physician Assistants and Nurse Practitioners) who all work together to provide you with the care you need, when you need it. You will need a follow up virtual appointment in 2 weeks: Wednesday, 07/24/2018, at 4:00 pm.      Metoprolol tablets What is this medicine? METOPROLOL (me TOE proe lole) is a beta-blocker. Beta-blockers reduce the workload on the heart and help it to beat more regularly. This medicine is used to treat high blood pressure and to prevent chest pain. It is also used to after a heart attack and to prevent an additional heart attack from occurring. This medicine may be used for other purposes; ask your health care provider or pharmacist if you have questions. COMMON BRAND NAME(S): Lopressor What should I tell my health care provider before I take this medicine? They need to know if you have any of these conditions: -diabetes -heart or vessel disease like slow heart rate, worsening heart failure, heart block, sick sinus syndrome or Raynaud's disease -kidney disease -  liver disease -lung or breathing disease, like asthma or emphysema -pheochromocytoma -thyroid disease -an unusual or allergic reaction to metoprolol, other beta-blockers, medicines, foods, dyes, or preservatives -pregnant or trying to get pregnant -breast-feeding How should I use this medicine? Take this medicine by mouth with a drink of water. Follow the directions on the prescription label. Take this  medicine immediately after meals. Take your doses at regular intervals. Do not take more medicine than directed. Do not stop taking this medicine suddenly. This could lead to serious heart-related effects. Talk to your pediatrician regarding the use of this medicine in children. Special care may be needed. Overdosage: If you think you have taken too much of this medicine contact a poison control center or emergency room at once. NOTE: This medicine is only for you. Do not share this medicine with others. What if I miss a dose? If you miss a dose, take it as soon as you can. If it is almost time for your next dose, take only that dose. Do not take double or extra doses. What may interact with this medicine? This medicine may interact with the following medications: -certain medicines for blood pressure, heart disease, irregular heart beat -certain medicines for depression like monoamine oxidase (MAO) inhibitors, fluoxetine, or paroxetine -clonidine -dobutamine -epinephrine -isoproterenol -reserpine This list may not describe all possible interactions. Give your health care provider a list of all the medicines, herbs, non-prescription drugs, or dietary supplements you use. Also tell them if you smoke, drink alcohol, or use illegal drugs. Some items may interact with your medicine. What should I watch for while using this medicine? Visit your doctor or health care professional for regular check ups. Contact your doctor right away if your symptoms worsen. Check your blood pressure and pulse rate regularly. Ask your health care professional what your blood pressure and pulse rate should be, and when you should contact them. You may get drowsy or dizzy. Do not drive, use machinery, or do anything that needs mental alertness until you know how this medicine affects you. Do not sit or stand up quickly, especially if you are an older patient. This reduces the risk of dizzy or fainting spells. Contact your  doctor if these symptoms continue. Alcohol may interfere with the effect of this medicine. Avoid alcoholic drinks. What side effects may I notice from receiving this medicine? Side effects that you should report to your doctor or health care professional as soon as possible: -allergic reactions like skin rash, itching or hives -cold or numb hands or feet -depression -difficulty breathing -faint -fever with sore throat -irregular heartbeat, chest pain -rapid weight gain -swollen legs or ankles Side effects that usually do not require medical attention (report to your doctor or health care professional if they continue or are bothersome): -anxiety or nervousness -change in sex drive or performance -dry skin -headache -nightmares or trouble sleeping -short term memory loss -stomach upset or diarrhea -unusually tired This list may not describe all possible side effects. Call your doctor for medical advice about side effects. You may report side effects to FDA at 1-800-FDA-1088. Where should I keep my medicine? Keep out of the reach of children. Store at room temperature between 15 and 30 degrees C (59 and 86 degrees F). Throw away any unused medicine after the expiration date. NOTE: This sheet is a summary. It may not cover all possible information. If you have questions about this medicine, talk to your doctor, pharmacist, or health care provider.  2019 Elsevier/Gold  Standard (2012-11-08 14:40:36)

## 2018-07-09 ENCOUNTER — Telehealth: Payer: Self-pay | Admitting: *Deleted

## 2018-07-09 NOTE — Telephone Encounter (Addendum)
Pt contacted pre-catheterization scheduled at [facility] for: Wednesday July 10, 2018 8:30 AM Verified arrival time and place: [facility] Main Entrance A at:6:30 AM  No solid food after midnight prior to cath, clear liquids until 5 AM day of procedure. Contrast allergy:no   AM meds can be  taken pre-cath with sip of water including: ASA 81 mg   Confirmed patient has responsible person to drive home post procedure and observe 24 hours after arriving home: yes     Cardiac Questionnaire:    _____________   [id number] Pre-Screening Questions:  . Do you currently have a fever? no . Have you recently travelled on a cruise, internationally, or to [place], [place], [place], [place], [place], or [place], [place] [employer]) ? no . Have you been in contact with someone that is currently pending confirmation of Covid19 testing or has been confirmed to have the [place] virus?  no . Are you currently experiencing fatigue or cough? no . Are you currently experiencing new or worsening shortness of breath at rest or with minimal activity? no . Have you been in contact with someone that was recently sick with fever/cough/fatigue? no    Pt advised due to Covid-19 pandemic, [facility] is restricting visitors and only patients should present for check-in prior to their procedure. People will not be allowed to enter [facility] with the patient. At this time [facility] is not allowing visitors to all [facility] campuses.     I reviewed procedure instructions, Covid-19 screening questions, and visitor restrictions with patient's wife at patient's request.  Patient's wife states patient is VERY hard of hearing, wears hearing aid.

## 2018-07-10 ENCOUNTER — Telehealth: Payer: Self-pay | Admitting: Cardiology

## 2018-07-10 ENCOUNTER — Ambulatory Visit (HOSPITAL_COMMUNITY)
Admission: RE | Admit: 2018-07-10 | Discharge: 2018-07-10 | Disposition: A | Discharge status: Home / Self Care | Payer: PPO | Attending: Cardiovascular Disease | Admitting: Cardiovascular Disease

## 2018-07-10 ENCOUNTER — Encounter (HOSPITAL_COMMUNITY)
Admission: RE | Disposition: A | Discharge status: Home / Self Care | Payer: Self-pay | Source: Home / Self Care | Attending: Cardiovascular Disease

## 2018-07-10 ENCOUNTER — Other Ambulatory Visit: Payer: Self-pay

## 2018-07-10 DIAGNOSIS — I2510 Atherosclerotic heart disease of native coronary artery without angina pectoris: Secondary | ICD-10-CM

## 2018-07-10 DIAGNOSIS — Z823 Family history of stroke: Secondary | ICD-10-CM | POA: Diagnosis not present

## 2018-07-10 DIAGNOSIS — Z8249 Family history of ischemic heart disease and other diseases of the circulatory system: Secondary | ICD-10-CM | POA: Insufficient documentation

## 2018-07-10 DIAGNOSIS — I714 Abdominal aortic aneurysm, without rupture: Secondary | ICD-10-CM | POA: Insufficient documentation

## 2018-07-10 DIAGNOSIS — Z87891 Personal history of nicotine dependence: Secondary | ICD-10-CM | POA: Diagnosis not present

## 2018-07-10 DIAGNOSIS — R079 Chest pain, unspecified: Secondary | ICD-10-CM

## 2018-07-10 DIAGNOSIS — I25119 Atherosclerotic heart disease of native coronary artery with unspecified angina pectoris: Secondary | ICD-10-CM | POA: Diagnosis not present

## 2018-07-10 DIAGNOSIS — E119 Type 2 diabetes mellitus without complications: Secondary | ICD-10-CM | POA: Diagnosis not present

## 2018-07-10 DIAGNOSIS — R9439 Abnormal result of other cardiovascular function study: Secondary | ICD-10-CM | POA: Diagnosis present

## 2018-07-10 DIAGNOSIS — I10 Essential (primary) hypertension: Secondary | ICD-10-CM | POA: Diagnosis not present

## 2018-07-10 DIAGNOSIS — Z79899 Other long term (current) drug therapy: Secondary | ICD-10-CM | POA: Diagnosis not present

## 2018-07-10 DIAGNOSIS — I2584 Coronary atherosclerosis due to calcified coronary lesion: Secondary | ICD-10-CM | POA: Insufficient documentation

## 2018-07-10 DIAGNOSIS — Z881 Allergy status to other antibiotic agents status: Secondary | ICD-10-CM | POA: Diagnosis not present

## 2018-07-10 DIAGNOSIS — Z880 Allergy status to penicillin: Secondary | ICD-10-CM | POA: Insufficient documentation

## 2018-07-10 DIAGNOSIS — R0789 Other chest pain: Secondary | ICD-10-CM | POA: Diagnosis present

## 2018-07-10 DIAGNOSIS — Z7982 Long term (current) use of aspirin: Secondary | ICD-10-CM | POA: Insufficient documentation

## 2018-07-10 DIAGNOSIS — R931 Abnormal findings on diagnostic imaging of heart and coronary circulation: Secondary | ICD-10-CM | POA: Insufficient documentation

## 2018-07-10 DIAGNOSIS — E785 Hyperlipidemia, unspecified: Secondary | ICD-10-CM | POA: Insufficient documentation

## 2018-07-10 DIAGNOSIS — R011 Cardiac murmur, unspecified: Secondary | ICD-10-CM | POA: Diagnosis not present

## 2018-07-10 HISTORY — PX: LEFT HEART CATH AND CORONARY ANGIOGRAPHY: CATH118249

## 2018-07-10 LAB — BASIC METABOLIC PANEL
Anion gap: 13 (ref 5–15)
BUN: 8 mg/dL (ref 8–23)
CO2: 23 mmol/L (ref 22–32)
Calcium: 9.3 mg/dL (ref 8.9–10.3)
Chloride: 103 mmol/L (ref 98–111)
Creatinine, Ser: 0.86 mg/dL (ref 0.61–1.24)
GFR calc Af Amer: >60 mL/min (ref >60)
GFR calc non Af Amer: >60 mL/min (ref >60)
Glucose, Bld: 100 mg/dL — ABNORMAL HIGH (ref 70–99)
Potassium: 4.4 mmol/L (ref 3.5–5.1)
Sodium: 139 mmol/L (ref 135–145)

## 2018-07-10 SURGERY — LEFT HEART CATH AND CORONARY ANGIOGRAPHY
Anesthesia: LOCAL

## 2018-07-10 MED ORDER — LIDOCAINE HCL (PF) 1 % IJ SOLN
INTRAMUSCULAR | Status: DC | PRN
  Administered 2018-07-10: 2 mL via INTRADERMAL

## 2018-07-10 MED ORDER — ONDANSETRON HCL 4 MG/2ML IJ SOLN: 4.0000 mg | Freq: Four times a day (QID) | INTRAMUSCULAR | Status: DC | PRN

## 2018-07-10 MED ORDER — LIDOCAINE HCL (PF) 1 % IJ SOLN
INTRAMUSCULAR | Status: AC
  Filled 2018-07-10: qty 30

## 2018-07-10 MED ORDER — SODIUM CHLORIDE 0.9% FLUSH: 3.0000 mL | Freq: Two times a day (BID) | INTRAVENOUS | Status: DC

## 2018-07-10 MED ORDER — SODIUM CHLORIDE 0.9 % WEIGHT BASED INFUSION: 1.0000 mL/kg/h | INTRAVENOUS | Status: DC

## 2018-07-10 MED ORDER — MIDAZOLAM HCL 2 MG/2ML IJ SOLN
INTRAMUSCULAR | Status: DC | PRN
  Administered 2018-07-10 (×2): 1 mg via INTRAVENOUS

## 2018-07-10 MED ORDER — SODIUM CHLORIDE 0.9 % IV SOLN: 250.0000 mL | INTRAVENOUS | Status: DC | PRN

## 2018-07-10 MED ORDER — SODIUM CHLORIDE 0.9% FLUSH: 3.0000 mL | INTRAVENOUS | Status: DC | PRN

## 2018-07-10 MED ORDER — FENTANYL CITRATE (PF) 100 MCG/2ML IJ SOLN
INTRAMUSCULAR | Status: DC | PRN
  Administered 2018-07-10: 25 ug via INTRAVENOUS

## 2018-07-10 MED ORDER — HYDRALAZINE HCL 20 MG/ML IJ SOLN: 10.0000 mg | INTRAMUSCULAR | Status: DC | PRN

## 2018-07-10 MED ORDER — HEPARIN (PORCINE) IN NACL 1000-0.9 UT/500ML-% IV SOLN
INTRAVENOUS | Status: AC
  Filled 2018-07-10: qty 1000

## 2018-07-10 MED ORDER — HEPARIN (PORCINE) IN NACL 1000-0.9 UT/500ML-% IV SOLN
INTRAVENOUS | Status: DC | PRN
  Administered 2018-07-10 (×2): 500 mL

## 2018-07-10 MED ORDER — ACETAMINOPHEN 325 MG PO TABS: 650.0000 mg | ORAL_TABLET | ORAL | Status: DC | PRN

## 2018-07-10 MED ORDER — MIDAZOLAM HCL 2 MG/2ML IJ SOLN
INTRAMUSCULAR | Status: AC
  Filled 2018-07-10: qty 2

## 2018-07-10 MED ORDER — SODIUM CHLORIDE 0.9 % WEIGHT BASED INFUSION
3.0000 mL/kg/h | INTRAVENOUS | Status: AC
  Administered 2018-07-10: 08:00:00 3 mL/kg/h via INTRAVENOUS

## 2018-07-10 MED ORDER — VERAPAMIL HCL 2.5 MG/ML IV SOLN
INTRAVENOUS | Status: AC
  Filled 2018-07-10: qty 2

## 2018-07-10 MED ORDER — VERAPAMIL HCL 2.5 MG/ML IV SOLN
INTRAVENOUS | Status: DC | PRN
  Administered 2018-07-10: 10 mL via INTRA_ARTERIAL

## 2018-07-10 MED ORDER — ASPIRIN 81 MG PO CHEW: 81.0000 mg | CHEWABLE_TABLET | ORAL | Status: DC

## 2018-07-10 MED ORDER — LABETALOL HCL 5 MG/ML IV SOLN: 10.0000 mg | INTRAVENOUS | Status: DC | PRN

## 2018-07-10 MED ORDER — IOHEXOL 350 MG/ML SOLN
INTRAVENOUS | Status: DC | PRN
  Administered 2018-07-10: 80 mL via INTRACARDIAC

## 2018-07-10 MED ORDER — FENTANYL CITRATE (PF) 100 MCG/2ML IJ SOLN
INTRAMUSCULAR | Status: AC
  Filled 2018-07-10: qty 2

## 2018-07-10 MED ORDER — HEPARIN SODIUM (PORCINE) 1000 UNIT/ML IJ SOLN
INTRAMUSCULAR | Status: DC | PRN
  Administered 2018-07-10: 4000 [IU] via INTRAVENOUS

## 2018-07-10 SURGICAL SUPPLY — 10 items
CATH 5FR JL3.5 JR4 ANG PIG MP (CATHETERS) ×2 IMPLANT
DEVICE RAD COMP TR BAND LRG (VASCULAR PRODUCTS) ×2 IMPLANT
GLIDESHEATH SLEND SS 6F .021 (SHEATH) ×2 IMPLANT
GUIDEWIRE INQWIRE 1.5J.035X260 (WIRE) ×1 IMPLANT
INQWIRE 1.5J .035X260CM (WIRE) ×2
KIT HEART LEFT (KITS) ×2 IMPLANT
PACK CARDIAC CATHETERIZATION (CUSTOM PROCEDURE TRAY) ×2 IMPLANT
SYR MEDRAD MARK 7 150ML (SYRINGE) ×2 IMPLANT
TRANSDUCER W/STOPCOCK (MISCELLANEOUS) ×2 IMPLANT
TUBING CIL FLEX 10 FLL-RA (TUBING) ×2 IMPLANT

## 2018-07-10 NOTE — Interval H&P Note (Signed)
Cath Lab Visit (complete for each Cath Lab visit)  Clinical Evaluation Leading to the Procedure:   ACS: No.  Non-ACS:    Anginal Classification: CCS III  Anti-ischemic medical therapy: Minimal Therapy (1 class of medications)  Non-Invasive Test Results: Low-risk stress test findings: cardiac mortality <1%/year  Prior CABG: No previous CABG      History and Physical Interval Note:  07/10/2018 7:41 AM  Jeffery Payne  has presented today for surgery, with the diagnosis of abnormal nuc - cad - chest pain.  The various methods of treatment have been discussed with the patient and family. After consideration of risks, benefits and other options for treatment, the patient has consented to  Procedure(s): LEFT HEART CATH AND CORONARY ANGIOGRAPHY (N/A) as a surgical intervention.  The patient's history has been reviewed, patient examined, no change in status, stable for surgery.  I have reviewed the patient's chart and labs.  Questions were answered to the patient's satisfaction.     Sherren Mocha

## 2018-07-10 NOTE — Discharge Instructions (Signed)
Radial Site Care ° °This sheet gives you information about how to care for yourself after your procedure. Your health care provider may also give you more specific instructions. If you have problems or questions, contact your health care provider. °What can I expect after the procedure? °After the procedure, it is common to have: °· Bruising and tenderness at the catheter insertion area. °Follow these instructions at home: °Medicines °· Take over-the-counter and prescription medicines only as told by your health care provider. °Insertion site care °· Follow instructions from your health care provider about how to take care of your insertion site. Make sure you: °? Wash your hands with soap and water before you change your bandage (dressing). If soap and water are not available, use hand sanitizer. °? Change your dressing as told by your health care provider. °? Leave stitches (sutures), skin glue, or adhesive strips in place. These skin closures may need to stay in place for 2 weeks or longer. If adhesive strip edges start to loosen and curl up, you may trim the loose edges. Do not remove adhesive strips completely unless your health care provider tells you to do that. °· Check your insertion site every day for signs of infection. Check for: °? Redness, swelling, or pain. °? Fluid or blood. °? Pus or a bad smell. °? Warmth. °· Do not take baths, swim, or use a hot tub until your health care provider approves. °· You may shower 24-48 hours after the procedure, or as directed by your health care provider. °? Remove the dressing and gently wash the site with plain soap and water. °? Pat the area dry with a clean towel. °? Do not rub the site. That could cause bleeding. °· Do not apply powder or lotion to the site. °Activity ° °· For 24 hours after the procedure, or as directed by your health care provider: °? Do not flex or bend the affected arm. °? Do not push or pull heavy objects with the affected arm. °? Do not  drive yourself home from the hospital or clinic. You may drive 24 hours after the procedure unless your health care provider tells you not to. °? Do not operate machinery or power tools. °· Do not lift anything that is heavier than 10 lb (4.5 kg), or the limit that you are told, until your health care provider says that it is safe. °· Ask your health care provider when it is okay to: °? Return to work or school. °? Resume usual physical activities or sports. °? Resume sexual activity. °General instructions °· If the catheter site starts to bleed, raise your arm and put firm pressure on the site. If the bleeding does not stop, get help right away. This is a medical emergency. °· If you went home on the same day as your procedure, a responsible adult should be with you for the first 24 hours after you arrive home. °· Keep all follow-up visits as told by your health care provider. This is important. °Contact a health care provider if: °· You have a fever. °· You have redness, swelling, or yellow drainage around your insertion site. °Get help right away if: °· You have unusual pain at the radial site. °· The catheter insertion area swells very fast. °· The insertion area is bleeding, and the bleeding does not stop when you hold steady pressure on the area. °· Your arm or hand becomes pale, cool, tingly, or numb. °These symptoms may represent a serious problem   that is an emergency. Do not wait to see if the symptoms will go away. Get medical help right away. Call your local emergency services (911 in the [place] Do not drive yourself to the hospital. °Summary °· After the procedure, it is common to have bruising and tenderness at the site. °· Follow instructions from your health care provider about how to take care of your radial site wound. Check the wound every day for signs of infection. °· Do not lift anything that is heavier than 10 lb (4.5 kg), or the limit that you are told, until your health care provider says  that it is safe. °This information is not intended to replace advice given to you by your health care provider. Make sure you discuss any questions you have with your health care provider. °Document Released: 04/08/2010 Document Revised: 04/11/2017 Document Reviewed: 04/11/2017 °Elsevier Interactive Patient Education © 2019 Elsevier Inc. ° °

## 2018-07-10 NOTE — Telephone Encounter (Signed)
Patient's wife called wanting to be sure a copy of the heart cath report is sent to Dr Legrand Como Liniger's office  [place] office in [place]

## 2018-07-10 NOTE — Progress Notes (Signed)
Gave d/c instructions to Jeffery Payne, wife, via telephone due to [place] restrictions.  No questions at this time and Jeffery Payne verbalized understanding of instructions

## 2018-07-10 NOTE — Telephone Encounter (Signed)
LHC results routed to Dr Noberto Retort as requested.

## 2018-07-11 ENCOUNTER — Encounter (HOSPITAL_COMMUNITY): Payer: Self-pay | Admitting: Cardiovascular Disease

## 2018-07-17 DIAGNOSIS — K219 Gastro-esophageal reflux disease without esophagitis: Secondary | ICD-10-CM | POA: Diagnosis not present

## 2018-07-17 DIAGNOSIS — R079 Chest pain, unspecified: Secondary | ICD-10-CM | POA: Diagnosis not present

## 2018-07-17 DIAGNOSIS — R1011 Right upper quadrant pain: Secondary | ICD-10-CM | POA: Diagnosis not present

## 2018-07-24 ENCOUNTER — Telehealth (INDEPENDENT_AMBULATORY_CARE_PROVIDER_SITE_OTHER): Payer: PPO | Admitting: Cardiology

## 2018-07-24 ENCOUNTER — Other Ambulatory Visit: Payer: Self-pay

## 2018-07-24 ENCOUNTER — Encounter: Payer: Self-pay | Admitting: Cardiology

## 2018-07-24 VITALS — BP 116/63 | HR 63 | Ht 68.0 in | Wt 155.0 lb

## 2018-07-24 DIAGNOSIS — I2510 Atherosclerotic heart disease of native coronary artery without angina pectoris: Secondary | ICD-10-CM | POA: Insufficient documentation

## 2018-07-24 DIAGNOSIS — Z87891 Personal history of nicotine dependence: Secondary | ICD-10-CM

## 2018-07-24 DIAGNOSIS — Z9049 Acquired absence of other specified parts of digestive tract: Secondary | ICD-10-CM | POA: Diagnosis not present

## 2018-07-24 DIAGNOSIS — I10 Essential (primary) hypertension: Secondary | ICD-10-CM | POA: Diagnosis not present

## 2018-07-24 DIAGNOSIS — E785 Hyperlipidemia, unspecified: Secondary | ICD-10-CM

## 2018-07-24 DIAGNOSIS — E782 Mixed hyperlipidemia: Secondary | ICD-10-CM

## 2018-07-24 DIAGNOSIS — Z79899 Other long term (current) drug therapy: Secondary | ICD-10-CM

## 2018-07-24 NOTE — Patient Instructions (Signed)
Medication Instructions:  Your physician recommends that you continue on your current medications as directed. Please refer to the Current Medication list given to you today.  If you need a refill on your cardiac medications before your next appointment, please call your pharmacy.   Lab work: None If you have labs (blood work) drawn today and your tests are completely normal, you will receive your results only by: Marland Kitchen MyChart Message (if you have MyChart) OR . A paper copy in the mail If you have any lab test that is abnormal or we need to change your treatment, we will call you to review the results.  Testing/Procedures:None  Follow-Up: At [facility], you and your health needs are our priority.  As part of our continuing mission to provide you with exceptional heart care, we have created designated Provider Care Teams.  These Care Teams include your primary Cardiologist (physician) and Advanced Practice Providers (APPs -  Physician Assistants and Nurse Practitioners) who all work together to provide you with the care you need, when you need it. You will need a follow up appointment  AS NEEDED Any Other Special Instructions Will Be Listed Below (If Applicable).

## 2018-07-24 NOTE — Progress Notes (Signed)
Virtual Visit via Telephone Note   This visit type was conducted due to national recommendations for restrictions regarding the COVID-19 Pandemic (e.g. social distancing) in an effort to limit this patient's exposure and mitigate transmission in our community.  Due to his co-morbid illnesses, this patient is at least at moderate risk for complications without adequate follow up.  This format is felt to be most appropriate for this patient at this time.  The patient did not have access to video technology/had technical difficulties with video requiring transitioning to audio format only (telephone).  All issues noted in this document were discussed and addressed.  No physical exam could be performed with this format.  Please refer to the patient's chart for his  consent to telehealth for [facility].   Date:  07/24/2018   ID:  Jeffery Payne, DOB September 27, 1950, MRN [id number]  Patient Location: Home Provider Location: Home  PCP:  [name]., MD  Cardiologist:  No primary care provider on file. Dr Bettina Gavia Electrophysiologist:  None   Evaluation Performed:  Follow-Up Visit  Chief Complaint:  CAD recent heart cath  History of Present Illness:    Jeffery Payne is a 68 year old man recent coronary angiogram showing mild CAD who is pending elective cholecystectomy with Dr. linger last seen by me 07/08/2018 with atypical chest pain and abnormal myocardial perfusion study.  Left heart cath 07/10/18:   Conclusion    The left ventricular systolic function is normal.  LV end diastolic pressure is normal.  The left ventricular ejection fraction is 55-65% by visual estimate.  Mid RCA lesion is 50% stenosed.  Ost 2nd Mrg to 2nd Mrg lesion is 50% stenosed.   1.  Mild nonobstructive coronary artery disease with diffuse coronary calcification (left dominant coronary system) 2.  Normal LV systolic function and normal LVEDP Suspect noncardiac chest pain   He had no gradient LV to aorta- no  aortic stenosis.  The patient does not have symptoms concerning for COVID-19 infection (fever, chills, cough, or new shortness of breath).   He continues to have right upper quadrant pain and esophageal burning and is scheduled for endoscopic evaluation 1 week.  I have asked him to stay off aspirin and he can stop his beta-blocker.  His heart catheterization showed mild nonobstructive CAD.  Mildly further follow-up in my office on a as needed basis. Past Medical History:  Diagnosis Date  . Abdominal aortic aneurysm (AAA) [facility])    Past Surgical History:  Procedure Laterality Date  . FOOT SURGERY    . KNEE SURGERY    . LEFT HEART CATH AND CORONARY ANGIOGRAPHY N/A 07/10/2018   Procedure: LEFT HEART CATH AND CORONARY ANGIOGRAPHY;  Surgeon: Sherren Mocha, MD;  Location: [place] CV LAB;  Service: Cardiovascular;  Laterality: N/A;  . NOSE SURGERY       No outpatient medications have been marked as taking for the 07/24/18 encounter (Appointment) with Richardo Priest, MD.     Allergies:   Ciprofloxacin; Flagyl [metronidazole]; and Penicillin g   Social History   Tobacco Use  . Smoking status: Former Smoker    Years: 20.00    Types: Cigarettes    Last attempt to quit: 1990    Years since quitting: 30.3  . Smokeless tobacco: Former Systems developer    Types: Chew  Substance Use Topics  . Alcohol use: Not Currently  . Drug use: Not Currently     Family Hx: The patient's family history includes CAD in his father; Lung  disease in his brother and mother; Stroke in his father.  ROS:   Please see the history of present illness.     All other systems reviewed and are negative.   Prior CV studies:   The following studies were reviewed today:    Labs/Other Tests and Data Reviewed:    EKG:  No ECG reviewed.  Recent Labs: 07/04/2018: ALT 34; Hemoglobin 14.8; Platelets 175 07/10/2018: BUN 8; Creatinine, Ser 0.86; Potassium 4.4; Sodium 139   Recent Lipid Panel No results found for: CHOL,  TRIG, HDL, CHOLHDL, LDLCALC, LDLDIRECT  Wt Readings from Last 3 Encounters:  07/10/18 165 lb (74.8 kg)  07/08/18 169 lb (76.7 kg)  07/04/18 165 lb (74.8 kg)     Objective:    Vital Signs:  There were no vitals taken for this visit.   VITAL SIGNS:  reviewed his thought and cognition normal alert oriented no respiratory distress or wheezing  ASSESSMENT & PLAN:    1. CAD mild does not appear to be responsible for his severe chest pain syndrome I think at this time with question of esophageal disease I will hold aspirin and I like to see him back in my office in 6 to 12 months or as needed 2. Hypertension stable continue current treatment 3. Dyslipidemia stable continue statin  COVID-19 Education: The signs and symptoms of COVID-19 were discussed with the patient and how to seek care for testing (follow up with PCP or arrange E-visit).  The importance of social distancing was discussed today.  Time:   Today, I have spent 20 minutes with the patient with telehealth technology discussing the above problems.     Medication Adjustments/Labs and Tests Ordered: Current medicines are reviewed at length with the patient today.  Concerns regarding medicines are outlined above.   Tests Ordered: No orders of the defined types were placed in this encounter.   Medication Changes: No orders of the defined types were placed in this encounter.   Disposition:  Follow up prn  Signed, Shirlee More, MD  07/24/2018 2:22 PM    [facility]

## 2018-08-01 DIAGNOSIS — K219 Gastro-esophageal reflux disease without esophagitis: Secondary | ICD-10-CM | POA: Diagnosis not present

## 2018-08-01 DIAGNOSIS — R0789 Other chest pain: Secondary | ICD-10-CM | POA: Diagnosis not present

## 2018-08-01 DIAGNOSIS — R12 Heartburn: Secondary | ICD-10-CM | POA: Diagnosis not present

## 2018-08-01 DIAGNOSIS — R079 Chest pain, unspecified: Secondary | ICD-10-CM | POA: Diagnosis not present

## 2018-08-01 DIAGNOSIS — R1310 Dysphagia, unspecified: Secondary | ICD-10-CM | POA: Diagnosis not present

## 2018-08-01 DIAGNOSIS — Z79899 Other long term (current) drug therapy: Secondary | ICD-10-CM | POA: Diagnosis not present

## 2018-10-24 DIAGNOSIS — E7800 Pure hypercholesterolemia, unspecified: Secondary | ICD-10-CM | POA: Diagnosis not present

## 2018-10-24 DIAGNOSIS — E559 Vitamin D deficiency, unspecified: Secondary | ICD-10-CM | POA: Diagnosis not present

## 2018-10-24 DIAGNOSIS — K219 Gastro-esophageal reflux disease without esophagitis: Secondary | ICD-10-CM | POA: Diagnosis not present

## 2018-10-24 DIAGNOSIS — K589 Irritable bowel syndrome without diarrhea: Secondary | ICD-10-CM | POA: Diagnosis not present

## 2018-10-24 DIAGNOSIS — Z9181 History of falling: Secondary | ICD-10-CM | POA: Diagnosis not present

## 2018-10-24 DIAGNOSIS — J309 Allergic rhinitis, unspecified: Secondary | ICD-10-CM | POA: Diagnosis not present

## 2018-10-24 DIAGNOSIS — I10 Essential (primary) hypertension: Secondary | ICD-10-CM | POA: Diagnosis not present

## 2018-10-24 DIAGNOSIS — Z1331 Encounter for screening for depression: Secondary | ICD-10-CM | POA: Diagnosis not present

## 2019-01-28 DIAGNOSIS — E559 Vitamin D deficiency, unspecified: Secondary | ICD-10-CM | POA: Diagnosis not present

## 2019-01-28 DIAGNOSIS — Z9181 History of falling: Secondary | ICD-10-CM | POA: Diagnosis not present

## 2019-01-28 DIAGNOSIS — I10 Essential (primary) hypertension: Secondary | ICD-10-CM | POA: Diagnosis not present

## 2019-01-28 DIAGNOSIS — K219 Gastro-esophageal reflux disease without esophagitis: Secondary | ICD-10-CM | POA: Diagnosis not present

## 2019-01-28 DIAGNOSIS — J309 Allergic rhinitis, unspecified: Secondary | ICD-10-CM | POA: Diagnosis not present

## 2019-01-28 DIAGNOSIS — E7800 Pure hypercholesterolemia, unspecified: Secondary | ICD-10-CM | POA: Diagnosis not present

## 2019-01-28 DIAGNOSIS — Z1331 Encounter for screening for depression: Secondary | ICD-10-CM | POA: Diagnosis not present

## 2019-01-28 DIAGNOSIS — E538 Deficiency of other specified B group vitamins: Secondary | ICD-10-CM | POA: Diagnosis not present

## 2019-01-28 DIAGNOSIS — K589 Irritable bowel syndrome without diarrhea: Secondary | ICD-10-CM | POA: Diagnosis not present

## 2019-03-02 DIAGNOSIS — R6889 Other general symptoms and signs: Secondary | ICD-10-CM | POA: Diagnosis not present

## 2019-03-02 DIAGNOSIS — U071 COVID-19: Secondary | ICD-10-CM | POA: Diagnosis not present

## 2019-03-05 DIAGNOSIS — R110 Nausea: Secondary | ICD-10-CM | POA: Diagnosis not present

## 2019-03-05 DIAGNOSIS — J069 Acute upper respiratory infection, unspecified: Secondary | ICD-10-CM | POA: Diagnosis not present

## 2019-03-08 DIAGNOSIS — U071 COVID-19: Secondary | ICD-10-CM | POA: Diagnosis not present

## 2019-03-08 DIAGNOSIS — J9601 Acute respiratory failure with hypoxia: Secondary | ICD-10-CM | POA: Diagnosis not present

## 2019-03-08 DIAGNOSIS — R05 Cough: Secondary | ICD-10-CM | POA: Diagnosis not present

## 2019-03-08 DIAGNOSIS — J1289 Other viral pneumonia: Secondary | ICD-10-CM | POA: Diagnosis not present

## 2019-03-08 DIAGNOSIS — R0602 Shortness of breath: Secondary | ICD-10-CM | POA: Diagnosis not present

## 2019-03-09 ENCOUNTER — Inpatient Hospital Stay (HOSPITAL_COMMUNITY)
Admission: AD | Admit: 2019-03-09 | Discharge: 2019-03-12 | DRG: 177 | Disposition: A | Discharge status: Home Health | Payer: PPO | Source: Other Acute Inpatient Hospital | Attending: Internal Medicine | Admitting: Internal Medicine

## 2019-03-09 ENCOUNTER — Other Ambulatory Visit: Payer: Self-pay

## 2019-03-09 ENCOUNTER — Encounter (HOSPITAL_COMMUNITY): Payer: Self-pay | Admitting: Family Medicine

## 2019-03-09 DIAGNOSIS — J9601 Acute respiratory failure with hypoxia: Secondary | ICD-10-CM | POA: Diagnosis present

## 2019-03-09 DIAGNOSIS — Z880 Allergy status to penicillin: Secondary | ICD-10-CM | POA: Diagnosis not present

## 2019-03-09 DIAGNOSIS — E785 Hyperlipidemia, unspecified: Secondary | ICD-10-CM | POA: Diagnosis not present

## 2019-03-09 DIAGNOSIS — I714 Abdominal aortic aneurysm, without rupture, unspecified: Secondary | ICD-10-CM | POA: Diagnosis present

## 2019-03-09 DIAGNOSIS — E871 Hypo-osmolality and hyponatremia: Secondary | ICD-10-CM | POA: Diagnosis not present

## 2019-03-09 DIAGNOSIS — Z823 Family history of stroke: Secondary | ICD-10-CM | POA: Diagnosis not present

## 2019-03-09 DIAGNOSIS — Z836 Family history of other diseases of the respiratory system: Secondary | ICD-10-CM | POA: Diagnosis not present

## 2019-03-09 DIAGNOSIS — Z8249 Family history of ischemic heart disease and other diseases of the circulatory system: Secondary | ICD-10-CM | POA: Diagnosis not present

## 2019-03-09 DIAGNOSIS — R279 Unspecified lack of coordination: Secondary | ICD-10-CM | POA: Diagnosis not present

## 2019-03-09 DIAGNOSIS — Z888 Allergy status to other drugs, medicaments and biological substances status: Secondary | ICD-10-CM | POA: Diagnosis not present

## 2019-03-09 DIAGNOSIS — I10 Essential (primary) hypertension: Secondary | ICD-10-CM | POA: Diagnosis present

## 2019-03-09 DIAGNOSIS — R001 Bradycardia, unspecified: Secondary | ICD-10-CM | POA: Diagnosis not present

## 2019-03-09 DIAGNOSIS — Z9981 Dependence on supplemental oxygen: Secondary | ICD-10-CM

## 2019-03-09 DIAGNOSIS — J1289 Other viral pneumonia: Secondary | ICD-10-CM | POA: Diagnosis not present

## 2019-03-09 DIAGNOSIS — Z87891 Personal history of nicotine dependence: Secondary | ICD-10-CM

## 2019-03-09 DIAGNOSIS — D72819 Decreased white blood cell count, unspecified: Secondary | ICD-10-CM | POA: Diagnosis not present

## 2019-03-09 DIAGNOSIS — Z79899 Other long term (current) drug therapy: Secondary | ICD-10-CM

## 2019-03-09 DIAGNOSIS — E119 Type 2 diabetes mellitus without complications: Secondary | ICD-10-CM | POA: Diagnosis not present

## 2019-03-09 DIAGNOSIS — K219 Gastro-esophageal reflux disease without esophagitis: Secondary | ICD-10-CM | POA: Diagnosis not present

## 2019-03-09 DIAGNOSIS — R509 Fever, unspecified: Secondary | ICD-10-CM | POA: Diagnosis not present

## 2019-03-09 DIAGNOSIS — E860 Dehydration: Secondary | ICD-10-CM | POA: Diagnosis present

## 2019-03-09 DIAGNOSIS — Z881 Allergy status to other antibiotic agents status: Secondary | ICD-10-CM | POA: Diagnosis not present

## 2019-03-09 DIAGNOSIS — J1282 Pneumonia due to coronavirus disease 2019: Secondary | ICD-10-CM

## 2019-03-09 DIAGNOSIS — U071 COVID-19: Principal | ICD-10-CM | POA: Diagnosis present

## 2019-03-09 DIAGNOSIS — H9190 Unspecified hearing loss, unspecified ear: Secondary | ICD-10-CM | POA: Diagnosis not present

## 2019-03-09 DIAGNOSIS — Z743 Need for continuous supervision: Secondary | ICD-10-CM | POA: Diagnosis not present

## 2019-03-09 DIAGNOSIS — J9600 Acute respiratory failure, unspecified whether with hypoxia or hypercapnia: Secondary | ICD-10-CM | POA: Diagnosis present

## 2019-03-09 DIAGNOSIS — R0902 Hypoxemia: Secondary | ICD-10-CM | POA: Diagnosis not present

## 2019-03-09 LAB — COMPREHENSIVE METABOLIC PANEL
ALT: 18 U/L (ref 0–44)
AST: 33 U/L (ref 15–41)
Albumin: 2.9 g/dL — ABNORMAL LOW (ref 3.5–5.0)
Alkaline Phosphatase: 39 U/L (ref 38–126)
Anion gap: 11 (ref 5–15)
BUN: 11 mg/dL (ref 8–23)
CO2: 25 mmol/L (ref 22–32)
Calcium: 7.9 mg/dL — ABNORMAL LOW (ref 8.9–10.3)
Chloride: 94 mmol/L — ABNORMAL LOW (ref 98–111)
Creatinine, Ser: 0.56 mg/dL — ABNORMAL LOW (ref 0.61–1.24)
GFR calc Af Amer: >60 mL/min (ref >60)
GFR calc non Af Amer: >60 mL/min (ref >60)
Glucose, Bld: 107 mg/dL — ABNORMAL HIGH (ref 70–99)
Potassium: 3.8 mmol/L (ref 3.5–5.1)
Sodium: 130 mmol/L — ABNORMAL LOW (ref 135–145)
Total Bilirubin: 0.6 mg/dL (ref 0.3–1.2)
Total Protein: 5.5 g/dL — ABNORMAL LOW (ref 6.5–8.1)

## 2019-03-09 LAB — CBC WITH DIFFERENTIAL/PLATELET
Abs Immature Granulocytes: 0.02 10*3/uL (ref 0.00–0.07)
Basophils Absolute: 0 10*3/uL (ref 0.0–0.1)
Basophils Relative: 0 %
Eosinophils Absolute: 0 10*3/uL (ref 0.0–0.5)
Eosinophils Relative: 0 %
HCT: 35 % — ABNORMAL LOW (ref 39.0–52.0)
Hemoglobin: 12 g/dL — ABNORMAL LOW (ref 13.0–17.0)
Immature Granulocytes: 1 %
Lymphocytes Relative: 20 %
Lymphs Abs: 0.7 10*3/uL (ref 0.7–4.0)
MCH: 31.8 pg (ref 26.0–34.0)
MCHC: 34.3 g/dL (ref 30.0–36.0)
MCV: 92.8 fL (ref 80.0–100.0)
Monocytes Absolute: 0.3 10*3/uL (ref 0.1–1.0)
Monocytes Relative: 8 %
Neutro Abs: 2.3 10*3/uL (ref 1.7–7.7)
Neutrophils Relative %: 71 %
Platelets: 166 10*3/uL (ref 150–400)
RBC: 3.77 MIL/uL — ABNORMAL LOW (ref 4.22–5.81)
RDW: 13.5 % (ref 11.5–15.5)
WBC: 3.3 10*3/uL — ABNORMAL LOW (ref 4.0–10.5)
nRBC: 0 % (ref 0.0–0.2)

## 2019-03-09 LAB — GLUCOSE, CAPILLARY
Glucose-Capillary: 105 mg/dL — ABNORMAL HIGH (ref 70–99)
Glucose-Capillary: 119 mg/dL — ABNORMAL HIGH (ref 70–99)
Glucose-Capillary: 115 mg/dL — ABNORMAL HIGH (ref 70–99)
Glucose-Capillary: 128 mg/dL — ABNORMAL HIGH (ref 70–99)

## 2019-03-09 LAB — BRAIN NATRIURETIC PEPTIDE: B Natriuretic Peptide: 75.8 pg/mL (ref 0.0–100.0)

## 2019-03-09 LAB — URIC ACID: Uric Acid, Serum: 2.5 mg/dL — ABNORMAL LOW (ref 3.7–8.6)

## 2019-03-09 LAB — TYPE AND SCREEN
ABO/RH(D): O POS
Antibody Screen: NEGATIVE

## 2019-03-09 LAB — D-DIMER, QUANTITATIVE: D-Dimer, Quant: 3.81 ug/mL-FEU — ABNORMAL HIGH (ref 0.00–0.50)

## 2019-03-09 LAB — ABO/RH: ABO/RH(D): O POS

## 2019-03-09 LAB — OSMOLALITY: Osmolality: 270 mOsm/kg — ABNORMAL LOW (ref 275–295)

## 2019-03-09 LAB — HEMOGLOBIN A1C
Hgb A1c MFr Bld: 6.2 % — ABNORMAL HIGH (ref 4.8–5.6)
Mean Plasma Glucose: 131.24 mg/dL

## 2019-03-09 LAB — MAGNESIUM: Magnesium: 2 mg/dL (ref 1.7–2.4)

## 2019-03-09 LAB — C-REACTIVE PROTEIN: CRP: 6.9 mg/dL — ABNORMAL HIGH (ref <1.0)

## 2019-03-09 MED ORDER — SODIUM CHLORIDE 0.9% FLUSH: 3.0000 mL | INTRAVENOUS | Status: DC | PRN

## 2019-03-09 MED ORDER — ENOXAPARIN SODIUM 40 MG/0.4ML ~~LOC~~ SOLN
40.0000 mg | Freq: Two times a day (BID) | SUBCUTANEOUS | Status: DC
  Administered 2019-03-09 – 2019-03-12 (×7): 40 mg via SUBCUTANEOUS
  Filled 2019-03-09 (×7): qty 0.4

## 2019-03-09 MED ORDER — ZINC SULFATE 220 (50 ZN) MG PO CAPS
220.0000 mg | ORAL_CAPSULE | Freq: Every day | ORAL | Status: DC
  Administered 2019-03-09 – 2019-03-12 (×4): 220 mg via ORAL
  Filled 2019-03-09 (×4): qty 1

## 2019-03-09 MED ORDER — ONDANSETRON HCL 4 MG PO TABS: 4.0000 mg | ORAL_TABLET | Freq: Four times a day (QID) | ORAL | Status: DC | PRN

## 2019-03-09 MED ORDER — PANTOPRAZOLE SODIUM 40 MG PO TBEC
40.0000 mg | DELAYED_RELEASE_TABLET | Freq: Every day | ORAL | Status: DC
  Administered 2019-03-09 – 2019-03-12 (×4): 40 mg via ORAL
  Filled 2019-03-09 (×4): qty 1

## 2019-03-09 MED ORDER — GUAIFENESIN-DM 100-10 MG/5ML PO SYRP
10.0000 mL | ORAL_SOLUTION | ORAL | Status: DC | PRN
  Administered 2019-03-09 – 2019-03-12 (×3): 10 mL via ORAL
  Filled 2019-03-09 (×3): qty 10

## 2019-03-09 MED ORDER — DEXAMETHASONE SODIUM PHOSPHATE 10 MG/ML IJ SOLN
6.0000 mg | INTRAMUSCULAR | Status: DC
  Administered 2019-03-09 – 2019-03-10 (×2): 6 mg via INTRAVENOUS
  Filled 2019-03-09 (×2): qty 1

## 2019-03-09 MED ORDER — PRAVASTATIN SODIUM 10 MG PO TABS
40.0000 mg | ORAL_TABLET | Freq: Every day | ORAL | Status: DC
  Administered 2019-03-09 – 2019-03-11 (×3): 40 mg via ORAL
  Filled 2019-03-09 (×2): qty 4
  Filled 2019-03-09: qty 1

## 2019-03-09 MED ORDER — LACTATED RINGERS IV SOLN: INTRAVENOUS | Status: AC

## 2019-03-09 MED ORDER — METOPROLOL TARTRATE 50 MG PO TABS
50.0000 mg | ORAL_TABLET | Freq: Two times a day (BID) | ORAL | Status: DC
  Administered 2019-03-09 – 2019-03-10 (×3): 50 mg via ORAL
  Filled 2019-03-09 (×4): qty 1

## 2019-03-09 MED ORDER — VITAMIN D3 25 MCG PO TABS
1000.0000 [IU] | ORAL_TABLET | Freq: Every day | ORAL | Status: DC
  Administered 2019-03-09 – 2019-03-12 (×4): 1000 [IU] via ORAL
  Filled 2019-03-09 (×8): qty 1

## 2019-03-09 MED ORDER — DICYCLOMINE HCL 10 MG PO CAPS
10.0000 mg | ORAL_CAPSULE | Freq: Three times a day (TID) | ORAL | Status: DC | PRN
  Filled 2019-03-09: qty 1

## 2019-03-09 MED ORDER — SODIUM CHLORIDE 0.9 % IV SOLN: 100.0000 mg | Freq: Every day | INTRAVENOUS | Status: DC

## 2019-03-09 MED ORDER — CYANOCOBALAMIN 250 MCG PO TABS
500.0000 ug | ORAL_TABLET | Freq: Every evening | ORAL | Status: DC
  Administered 2019-03-09 – 2019-03-11 (×3): 500 ug via ORAL
  Filled 2019-03-09 (×4): qty 2

## 2019-03-09 MED ORDER — ASPIRIN EC 81 MG PO TBEC
81.0000 mg | DELAYED_RELEASE_TABLET | Freq: Every day | ORAL | Status: DC
  Administered 2019-03-09 – 2019-03-11 (×3): 81 mg via ORAL
  Filled 2019-03-09 (×3): qty 1

## 2019-03-09 MED ORDER — HYDROCOD POLST-CPM POLST ER 10-8 MG/5ML PO SUER
5.0000 mL | Freq: Two times a day (BID) | ORAL | Status: DC | PRN
  Administered 2019-03-09 – 2019-03-12 (×5): 5 mL via ORAL
  Filled 2019-03-09 (×5): qty 5

## 2019-03-09 MED ORDER — ACETAMINOPHEN 325 MG PO TABS: 650.0000 mg | ORAL_TABLET | Freq: Four times a day (QID) | ORAL | Status: DC | PRN

## 2019-03-09 MED ORDER — SODIUM CHLORIDE 0.9% FLUSH: 3.0000 mL | Freq: Two times a day (BID) | INTRAVENOUS | Status: DC

## 2019-03-09 MED ORDER — HYDROCODONE-ACETAMINOPHEN 5-325 MG PO TABS: 1.0000 | ORAL_TABLET | Freq: Four times a day (QID) | ORAL | Status: DC | PRN

## 2019-03-09 MED ORDER — ONDANSETRON HCL 4 MG/2ML IJ SOLN: 4.0000 mg | Freq: Four times a day (QID) | INTRAMUSCULAR | Status: DC | PRN

## 2019-03-09 MED ORDER — AMLODIPINE BESYLATE 10 MG PO TABS
10.0000 mg | ORAL_TABLET | Freq: Every day | ORAL | Status: DC
  Administered 2019-03-09 – 2019-03-11 (×3): 10 mg via ORAL
  Filled 2019-03-09 (×3): qty 1

## 2019-03-09 MED ORDER — SODIUM CHLORIDE 0.9 % IV SOLN: 200.0000 mg | Freq: Once | INTRAVENOUS | Status: DC

## 2019-03-09 MED ORDER — SODIUM CHLORIDE 0.9 % IV SOLN
100.0000 mg | Freq: Every day | INTRAVENOUS | Status: AC
  Administered 2019-03-09 – 2019-03-12 (×4): 100 mg via INTRAVENOUS
  Filled 2019-03-09 (×4): qty 20

## 2019-03-09 MED ORDER — HYDROCOD POLST-CPM POLST ER 10-8 MG/5ML PO SUER
5.0000 mL | Freq: Two times a day (BID) | ORAL | Status: DC
  Administered 2019-03-09 – 2019-03-10 (×3): 5 mL via ORAL
  Filled 2019-03-09 (×3): qty 5

## 2019-03-09 MED ORDER — ASCORBIC ACID 500 MG PO TABS
500.0000 mg | ORAL_TABLET | Freq: Every day | ORAL | Status: DC
  Administered 2019-03-09 – 2019-03-12 (×4): 500 mg via ORAL
  Filled 2019-03-09 (×4): qty 1

## 2019-03-09 MED ORDER — SODIUM CHLORIDE 0.9 % IV SOLN: 250.0000 mL | INTRAVENOUS | Status: DC | PRN

## 2019-03-09 MED ORDER — INSULIN ASPART 100 UNIT/ML ~~LOC~~ SOLN
0.0000 [IU] | Freq: Three times a day (TID) | SUBCUTANEOUS | Status: DC
  Administered 2019-03-10: 1 [IU] via SUBCUTANEOUS
  Administered 2019-03-10: 2 [IU] via SUBCUTANEOUS
  Administered 2019-03-10 – 2019-03-11 (×2): 1 [IU] via SUBCUTANEOUS
  Administered 2019-03-11: 2 [IU] via SUBCUTANEOUS
  Administered 2019-03-12 (×2): 1 [IU] via SUBCUTANEOUS

## 2019-03-09 NOTE — Plan of Care (Signed)
  Problem: Education: Goal: Knowledge of risk factors and measures for prevention of condition will improve Outcome: Progressing   Problem: Coping: Goal: Psychosocial and spiritual needs will be supported Outcome: Progressing   Problem: Respiratory: Goal: Will maintain a patent airway Outcome: Progressing   Problem: Education: Goal: Knowledge of General Education information will improve Description: Including pain rating scale, medication(s)/side effects and non-pharmacologic comfort measures Outcome: Progressing   Problem: Activity: Goal: Risk for activity intolerance will decrease Outcome: Progressing   Problem: Pain Managment: Goal: General experience of comfort will improve Outcome: Progressing   Problem: Safety: Goal: Ability to remain free from injury will improve Outcome: Progressing

## 2019-03-09 NOTE — Plan of Care (Signed)
Patient is on 6L of oxygen nasal canula, oxygen saturation has been between 92-94%. Lungs are clear/diminished, patient did complain once about his cough. He does have PRN cough medicine, I administered robitussin PRN once during the shift. Patient vital signs stable.

## 2019-03-09 NOTE — Progress Notes (Signed)
PROGRESS NOTE                                                                                                                                                                                                             Patient Demographics:    Jeffery Payne, is a 68 y.o. male, DOB - August 18, 1950, VVO:[id number]  Outpatient Primary MD for the patient is [name]., MD    LOS - 0  Admit date - 03/09/2019    CC - SOB     Brief Narrative 68 year old Caucasian man with history of hypertension, GERD, dyslipidemia who was diagnosed with COVID-19 infection about 1 week before hospital admission presented to the ER with shortness of breath, he was found to be in acute hypoxic respiratory failure and admitted to [facility].   Subjective:    Jeffery Payne today has, No headache, No chest pain, No abdominal pain - No Nausea, No new weakness tingling or numbness, does have a cough and mild shortness of breath.   Assessment  & Plan :     1. Acute Hypoxic Resp. Failure due to Acute Covid 19 Viral Pneumonitis during the ongoing 2020 Covid 19 Pandemic - he is to have moderate disease, currently on 2 to 3 L nasal cannula oxygen but in no distress, does have persistent cough, has been started on steroids and remdesivir which will be continued.  Supportive care.  Monitor closely inflammatory markers and patient clinically.  Encouraged the patient to sit up in chair in the daytime use I-S and flutter valve for pulmonary toiletry and then prone in bed when at night.  Actemra off label use - patient was told that if COVID-19 pneumonitis gets worse we might potentially use Actemra off label, patient denies any known history of tuberculosis or hepatitis, understands the risks and benefits and wants to proceed with Actemra treatment if required.  SpO2: 91 % O2 Flow Rate (L/min): 3 L/min  Recent Labs  Lab 03/09/19 0711 03/09/19 0712  CRP 6.9*  --     DDIMER  --  3.81*    Hepatic Function Latest Ref Rng & Units 03/09/2019 07/04/2018  Total Protein 6.5 - 8.1 g/dL 5.5(L) 7.0  Albumin 3.5 - 5.0 g/dL 2.9(L) 4.5  AST 15 - 41 U/L 33 35  ALT 0 - 44 U/L 18 34  Alk Phosphatase  38 - 126 U/L 39 45  Total Bilirubin 0.3 - 1.2 mg/dL 0.6 0.8     2.  Essential hypertension - he has been started on combination of Norvasc and beta-blocker.  Will monitor and adjust.  3.  Hyponatremia.  Could be due to dehydration, check urine electrolytes, gentle hydration and monitor.  4.  GERD.  PPI.  5.  Dyslipidemia.  Resume statin.    Condition - Fair  Family Communication  :  Wife - 03/09/19  Code Status : Full  Diet :   Diet Order            Diet Heart Room service appropriate? Yes; Fluid consistency: Thin  Diet effective now               Disposition Plan  :  TBD  Consults  :  None  Procedures  :    PUD Prophylaxis : PPI  DVT Prophylaxis  :  Lovenox   Lab Results  Component Value Date   PLT 166 03/09/2019    Inpatient Medications  Scheduled Meds: . vitamin C  500 mg Oral Daily  . dexamethasone (DECADRON) injection  6 mg Intravenous Q24H  . enoxaparin (LOVENOX) injection  40 mg Subcutaneous Q12H  . insulin aspart  0-9 Units Subcutaneous TID WC  . zinc sulfate  220 mg Oral Daily   Continuous Infusions: . remdesivir 100 mg in NS 100 mL     PRN Meds:.acetaminophen, chlorpheniramine-HYDROcodone, guaiFENesin-dextromethorphan, ondansetron **OR** ondansetron (ZOFRAN) IV  Antibiotics  :    Anti-infectives (From admission, onward)   Start     Dose/Rate Route Frequency Ordered Stop   03/10/19 1000  remdesivir 100 mg in sodium chloride 0.9 % 100 mL IVPB  Status:  Discontinued     100 mg 200 mL/hr over 30 Minutes Intravenous Daily 03/09/19 0216 03/09/19 0218   03/09/19 1000  remdesivir 100 mg in sodium chloride 0.9 % 100 mL IVPB     100 mg 200 mL/hr over 30 Minutes Intravenous Daily 03/09/19 0224 03/13/19 0959   03/09/19  0230  remdesivir 200 mg in sodium chloride 0.9% 250 mL IVPB  Status:  Discontinued     200 mg 580 mL/hr over 30 Minutes Intravenous Once 03/09/19 0216 03/09/19 0218       Time Spent in minutes  30   Lala Lund M.D on 03/09/2019 at 9:54 AM  To page go to [email or web address] - password [facility]  [facility] -  Office  [phone]   See all Orders from today for further details    Objective:   Vitals:   03/09/19 0248 03/09/19 0400 03/09/19 0732 03/09/19 0741  BP:  (!) 120/57 (!) 160/82   Pulse:  66 81 78  Resp:  (!) 22 (!) 22   Temp:  98.2 F (36.8 C)    TempSrc:  Oral Oral   SpO2:  95% 92% 91%  Weight: 72.1 kg     Height: _0  (1.727 m)       Wt Readings from Last 3 Encounters:  03/09/19 72.1 kg  07/24/18 70.3 kg  07/10/18 74.8 kg     Intake/Output Summary (Last 24 hours) at 03/09/2019 0954 Last data filed at 03/09/2019 0600 Gross per 24 hour  Intake 140 ml  Output 180 ml  Net -40 ml     Physical Exam  Awake Alert,  No new F.N deficits, Normal affect [place].AT,PERRAL Supple Neck,No JVD, No cervical lymphadenopathy appriciated.  Symmetrical Chest wall movement, Good air movement bilaterally,  CTAB RRR,No Gallops,Rubs or new Murmurs, No Parasternal Heave +ve B.Sounds, Abd Soft, No tenderness, No organomegaly appriciated, No rebound - guarding or rigidity. No Cyanosis, Clubbing or edema, No new Rash or bruise      Data Review:    CBC Recent Labs  Lab 03/09/19 0712  WBC 3.3*  HGB 12.0*  HCT 35.0*  PLT 166  MCV 92.8  MCH 31.8  MCHC 34.3  RDW 13.5  LYMPHSABS 0.7  MONOABS 0.3  EOSABS 0.0  BASOSABS 0.0    Chemistries  Recent Labs  Lab 03/09/19 0712  NA 130*  K 3.8  CL 94*  CO2 25  GLUCOSE 107*  BUN 11  CREATININE 0.56*  CALCIUM 7.9*  MG 2.0  AST 33  ALT 18  ALKPHOS 39  BILITOT 0.6   ------------------------------------------------------------------------------------------------------------------ No results for input(s): CHOL,  HDL, LDLCALC, TRIG, CHOLHDL, LDLDIRECT in the last 72 hours.  Lab Results  Component Value Date   HGBA1C 6.2 (H) 03/09/2019   ------------------------------------------------------------------------------------------------------------------ No results for input(s): TSH, T4TOTAL, T3FREE, THYROIDAB in the last 72 hours.  Invalid input(s): FREET3  Cardiac Enzymes No results for input(s): CKMB, TROPONINI, MYOGLOBIN in the last 168 hours.  Invalid input(s): CK ------------------------------------------------------------------------------------------------------------------ No results found for: BNP  Micro Results No results found for this or any previous visit (from the past 240 hour(s)).  Radiology Reports No results found.

## 2019-03-09 NOTE — H&P (Signed)
History and Physical    Jeffery Payne [id number] DOB: Sep 02, 1950 DOA: 03/09/2019  PCP: [name]., MD  Patient coming from: Home  Chief Complaint: Shortness of breath  HPI: Jeffery Payne is a 68 y.o. male with medical history significant of diabetes diagnosed with Covid on 1213 comes in with shortness of breath worsening over the last several days with hypoxia 90% on 2 L.  Afebrile vital signs otherwise normal with some mild expiratory wheezing resolved with an albuterol neb at [facility].  He denies any nausea vomiting diarrhea.  Denies any chest pain.  He has been very weak.  Patient be referred for admission for bilateral pneumonia caused by COVID-19 infection and hypoxia.  He has been given Decadron and remdesivir along with Lovenox at [place].  Review of Systems: As per HPI otherwise 10 point review of systems negative.   Past Medical History:  Diagnosis Date  . Abdominal aortic aneurysm (AAA) ([place])   . Hearing loss 2019  . Hyperlipidemia 2014  . Hypertension 2014    Past Surgical History:  Procedure Laterality Date  . FOOT SURGERY    . KNEE SURGERY    . LEFT HEART CATH AND CORONARY ANGIOGRAPHY N/A 07/10/2018   Procedure: LEFT HEART CATH AND CORONARY ANGIOGRAPHY;  Surgeon: Sherren Mocha, MD;  Location: [facility];  Service: Cardiovascular;  Laterality: N/A;  . NOSE SURGERY       reports that he quit smoking about 30 years ago. His smoking use included cigarettes. He quit after 20.00 years of use. He has quit using smokeless tobacco.  His smokeless tobacco use included chew. He reports previous alcohol use. He reports previous drug use.  Allergies  Allergen Reactions  . Ciprofloxacin Nausea Only    Severe nausea  . Flagyl [Metronidazole] Nausea Only    Severe nausea  . Penicillin G Rash    Did it involve swelling of the face/tongue/throat, SOB, or low BP? Unk Did it involve sudden or severe rash/hives, skin peeling, or any reaction on the inside of your  mouth or nose? Unk Did you need to seek medical attention at a hospital or doctor's office? Unk When did it last happen? From childhood If all above answers are "NO", may proceed with cephalosporin use.     Family History  Problem Relation Age of Onset  . CAD Father   . Stroke Father   . Lung disease Mother   . Lung disease Brother     Prior to Admission medications   Medication Sig Start Date End Date Taking? Authorizing Provider  acetaminophen (TYLENOL) 325 MG tablet Take 650 mg by mouth every 6 (six) hours as needed (for pain).    [provider]  alum & mag hydroxide-simeth (MAALOX/MYLANTA) 200-200-20 MG/5ML suspension Take 30 mLs by mouth every 4 (four) hours as needed for indigestion or heartburn.    [provider]  amLODipine (NORVASC) 10 MG tablet Take 10 mg by mouth at bedtime.    [provider]  aspirin EC 81 MG tablet Take 81 mg by mouth at bedtime.    [provider]  cholecalciferol (VITAMIN D3) 25 MCG (1000 UT) tablet Take 1,000 Units by mouth daily.    [provider]  Cyanocobalamin (VITAMIN B-12) 2500 MCG SUBL Place 2,500 mcg under the tongue every evening.    [provider]  dexlansoprazole (DEXILANT) 60 MG capsule Take 60 mg by mouth 2 (two) times daily.    [provider]  dicyclomine (BENTYL)  10 MG capsule Take 10 mg by mouth 2 (two) times daily as needed.     [provider]  ergocalciferol (VITAMIN D2) 1.25 MG (50000 UT) capsule Take 50,000 Units by mouth every Sunday.    [provider]  HYDROcodone-acetaminophen (NORCO/VICODIN) 5-325 MG tablet Take 1 tablet by mouth every 6 (six) hours as needed. 07/04/18   Valarie Merino, MD  losartan (COZAAR) 100 MG tablet Take 100 mg by mouth at bedtime.    [provider]  metoprolol tartrate (LOPRESSOR) 25 MG tablet Take 1 tablet (25 mg total) by mouth 2 (two) times daily. 07/08/18 10/06/18  Richardo Priest, MD  ondansetron  (ZOFRAN) 4 MG tablet Take 4 mg by mouth every 6 (six) hours as needed for nausea or vomiting.    [provider]  pravastatin (PRAVACHOL) 40 MG tablet Take 40 mg by mouth at bedtime.  05/01/18   [provider]    Physical Exam: Vitals:   03/09/19 0200 03/09/19 0244 03/09/19 0248 03/09/19 0400  BP: 133/69   (!) 120/57  Pulse: 68 67  66  Resp: 15   (!) 22  Temp: 97.7 F (36.5 C) 97.7 F (36.5 C)  98.2 F (36.8 C)  TempSrc: Oral Oral  Oral  SpO2: 95%   95%  Weight: 72.6 kg  72.1 kg   Height:   5\' 8"  (1.727 m)       Constitutional: NAD, calm, comfortable Vitals:   03/09/19 0200 03/09/19 0244 03/09/19 0248 03/09/19 0400  BP: 133/69   (!) 120/57  Pulse: 68 67  66  Resp: 15   (!) 22  Temp: 97.7 F (36.5 C) 97.7 F (36.5 C)  98.2 F (36.8 C)  TempSrc: Oral Oral  Oral  SpO2: 95%   95%  Weight: 72.6 kg  72.1 kg   Height:   5\' 8"  (1.727 m)    Eyes: PERRL, lids and conjunctivae normal ENMT: Mucous membranes are moist. Posterior pharynx clear of any exudate or lesions.Normal dentition.  Neck: normal, supple, no masses, no thyromegaly Respiratory: clear to auscultation bilaterally, no wheezing, no crackles. Normal respiratory effort. No accessory muscle use.  Cardiovascular: Regular rate and rhythm, no murmurs / rubs / gallops. No extremity edema. 2+ pedal pulses. No carotid bruits.  Abdomen: no tenderness, no masses palpated. No hepatosplenomegaly. Bowel sounds positive.  Musculoskeletal: no clubbing / cyanosis. No joint deformity upper and lower extremities. Good ROM, no contractures. Normal muscle tone.  Skin: no rashes, lesions, ulcers. No induration Neurologic: CN 2-12 grossly intact. Sensation intact, DTR normal. Strength 5/5 in all 4.  Psychiatric: Normal judgment and insight. Alert and oriented x 3. Normal mood.    Labs on Admission: I have personally reviewed following labs and imaging studies  CBC: No results for input(s): WBC, NEUTROABS, HGB, HCT,  MCV, PLT in the last 168 hours. Basic Metabolic Panel: No results for input(s): NA, K, CL, CO2, GLUCOSE, BUN, CREATININE, CALCIUM, MG, PHOS in the last 168 hours. GFR: CrCl cannot be calculated (Patient's most recent lab result is older than the maximum 21 days allowed.). Liver Function Tests: No results for input(s): AST, ALT, ALKPHOS, BILITOT, PROT, ALBUMIN in the last 168 hours. No results for input(s): LIPASE, AMYLASE in the last 168 hours. No results for input(s): AMMONIA in the last 168 hours. Coagulation Profile: No results for input(s): INR, PROTIME in the last 168 hours. Cardiac Enzymes: No results for input(s): CKTOTAL, CKMB, CKMBINDEX, TROPONINI in the last 168 hours. BNP (last  3 results) No results for input(s): PROBNP in the last 8760 hours. HbA1C: No results for input(s): HGBA1C in the last 72 hours. CBG: No results for input(s): GLUCAP in the last 168 hours. Lipid Profile: No results for input(s): CHOL, HDL, LDLCALC, TRIG, CHOLHDL, LDLDIRECT in the last 72 hours. Thyroid Function Tests: No results for input(s): TSH, T4TOTAL, FREET4, T3FREE, THYROIDAB in the last 72 hours. Anemia Panel: No results for input(s): VITAMINB12, FOLATE, FERRITIN, TIBC, IRON, RETICCTPCT in the last 72 hours. Urine analysis: No results found for: COLORURINE, APPEARANCEUR, LABSPEC, PHURINE, GLUCOSEU, HGBUR, BILIRUBINUR, KETONESUR, PROTEINUR, UROBILINOGEN, NITRITE, LEUKOCYTESUR Sepsis Labs: !!!!!!!!!!!!!!!!!!!!!!!!!!!!!!!!!!!!!!!!!!!! @LABRCNTIP (procalcitonin:4,lacticidven:4) )No results found for this or any previous visit (from the past 240 hour(s)).   Radiological Exams on Admission: No results found.  Records reviewed from [facility] Case discussed with EDP at [facility]   Assessment/Plan 68 year old male with acute hypoxic respiratory failure secondary to bilateral Covid pneumonia Principal Problem:   Pneumonia due to COVID-19 virus-patient agreeable to remdesivir and Decadron.  Placed  on Lovenox also.  Inflammatory markers elevated CRP is 66.7.  LDH 1575.  AST ALT normal.  Creatinine potassium normal.  Sodium 127.  D-dimer was 4800.  Hemoglobin 12.9.  White count normal.  Chest x-ray shows bilateral pneumonia.  Procalcitonin level 0.08.  Troponin negative.  Patient also placed on vitamin C and zinc and Tylenol as needed for fevers.  Wean oxygen as tolerates.  Daily inflammatory markers will be monitored.  Active Problems:   Acute respiratory failure due to COVID-19 (HCC)-as above   Essential hypertension-stable   Hyperlipidemia-clarify home meds resume   Type 2 diabetes mellitus without complication, without long-term current use of insulin (HCC)-placed on sliding scale insulin at this time   AAA (abdominal aortic aneurysm) without rupture (HCC)-stable     DVT prophylaxis: Lovenox Code Status: Full Family Communication: None Disposition Plan: Days Consults called: None Admission status: Admission   Anissia Wessells A MD [facility]  If 7PM-7AM, please contact night-coverage [email or web address] Password [facility]  03/09/2019, 5:28 AM

## 2019-03-10 LAB — CBC WITH DIFFERENTIAL/PLATELET
Abs Immature Granulocytes: 0.02 10*3/uL (ref 0.00–0.07)
Basophils Absolute: 0 10*3/uL (ref 0.0–0.1)
Basophils Relative: 1 %
Eosinophils Absolute: 0 10*3/uL (ref 0.0–0.5)
Eosinophils Relative: 0 %
HCT: 37.6 % — ABNORMAL LOW (ref 39.0–52.0)
Hemoglobin: 12.9 g/dL — ABNORMAL LOW (ref 13.0–17.0)
Immature Granulocytes: 1 %
Lymphocytes Relative: 24 %
Lymphs Abs: 0.5 10*3/uL — ABNORMAL LOW (ref 0.7–4.0)
MCH: 31.8 pg (ref 26.0–34.0)
MCHC: 34.3 g/dL (ref 30.0–36.0)
MCV: 92.6 fL (ref 80.0–100.0)
Monocytes Absolute: 0.1 10*3/uL (ref 0.1–1.0)
Monocytes Relative: 4 %
Neutro Abs: 1.4 10*3/uL — ABNORMAL LOW (ref 1.7–7.7)
Neutrophils Relative %: 70 %
Platelets: 193 10*3/uL (ref 150–400)
RBC: 4.06 MIL/uL — ABNORMAL LOW (ref 4.22–5.81)
RDW: 13.6 % (ref 11.5–15.5)
WBC: 2.1 10*3/uL — ABNORMAL LOW (ref 4.0–10.5)
nRBC: 0 % (ref 0.0–0.2)

## 2019-03-10 LAB — COMPREHENSIVE METABOLIC PANEL
ALT: 19 U/L (ref 0–44)
AST: 33 U/L (ref 15–41)
Albumin: 2.8 g/dL — ABNORMAL LOW (ref 3.5–5.0)
Alkaline Phosphatase: 42 U/L (ref 38–126)
Anion gap: 13 (ref 5–15)
BUN: 15 mg/dL (ref 8–23)
CO2: 24 mmol/L (ref 22–32)
Calcium: 8.2 mg/dL — ABNORMAL LOW (ref 8.9–10.3)
Chloride: 94 mmol/L — ABNORMAL LOW (ref 98–111)
Creatinine, Ser: 0.5 mg/dL — ABNORMAL LOW (ref 0.61–1.24)
GFR calc Af Amer: >60 mL/min (ref >60)
GFR calc non Af Amer: >60 mL/min (ref >60)
Glucose, Bld: 129 mg/dL — ABNORMAL HIGH (ref 70–99)
Potassium: 4.6 mmol/L (ref 3.5–5.1)
Sodium: 131 mmol/L — ABNORMAL LOW (ref 135–145)
Total Bilirubin: 0.8 mg/dL (ref 0.3–1.2)
Total Protein: 5.5 g/dL — ABNORMAL LOW (ref 6.5–8.1)

## 2019-03-10 LAB — GLUCOSE, CAPILLARY
Glucose-Capillary: 148 mg/dL — ABNORMAL HIGH (ref 70–99)
Glucose-Capillary: 151 mg/dL — ABNORMAL HIGH (ref 70–99)
Glucose-Capillary: 128 mg/dL — ABNORMAL HIGH (ref 70–99)
Glucose-Capillary: 160 mg/dL — ABNORMAL HIGH (ref 70–99)

## 2019-03-10 LAB — BRAIN NATRIURETIC PEPTIDE: B Natriuretic Peptide: 204.2 pg/mL — ABNORMAL HIGH (ref 0.0–100.0)

## 2019-03-10 LAB — MAGNESIUM: Magnesium: 2.1 mg/dL (ref 1.7–2.4)

## 2019-03-10 LAB — D-DIMER, QUANTITATIVE: D-Dimer, Quant: 3.21 ug/mL-FEU — ABNORMAL HIGH (ref 0.00–0.50)

## 2019-03-10 LAB — C-REACTIVE PROTEIN: CRP: 6.7 mg/dL — ABNORMAL HIGH (ref <1.0)

## 2019-03-10 MED ORDER — ENSURE ENLIVE PO LIQD
237.0000 mL | Freq: Two times a day (BID) | ORAL | Status: DC
  Administered 2019-03-11 – 2019-03-12 (×3): 237 mL via ORAL

## 2019-03-10 NOTE — Evaluation (Signed)
Physical Therapy Evaluation Patient Details Name: Jeffery Payne MRN: [id number] DOB: 1950/09/26 Today's Date: 03/10/2019   History of Present Illness  68 year old Caucasian man with history of hypertension, GERD, dyslipidemia who was diagnosed with COVID-19 infection about 1 week before hospital admission presented to the ER with shortness of breath, he was found to be in acute hypoxic respiratory failure and admitted to [facility].  Clinical Impression  The patient is somewhat sluggish, mildly unsteady during ambulation, trunk forward flexed. Patient ambulated on 2 L with SPO2 > 88%. Patient should progress to return home. Pt admitted with above diagnosis. Pt currently with functional limitations due to the deficits listed below (see PT Problem List). Pt will benefit from skilled PT to increase their independence and safety with mobility to allow discharge to the venue listed below.       Follow Up Recommendations No PT follow up    Equipment Recommendations  (TBD ? SPC)    Recommendations for Other Services       Precautions / Restrictions Precautions Precautions: Fall Precaution Comments: monitor sats Restrictions Weight Bearing Restrictions: No      Mobility  Bed Mobility Overal bed mobility: Independent Bed Mobility: Supine to Sit     Supine to sit: Min guard     General bed mobility comments: for lines and safety   Transfers Overall transfer level: Needs assistance Equipment used: None Transfers: Sit to/from Stand Sit to Stand: Min guard         General transfer comment: for lines and balance  Ambulation/Gait Ambulation/Gait assistance: Min assist Gait Distance (Feet): 120 Feet Assistive device: 1 person hand held assist Gait Pattern/deviations: Decreased stride length;Trunk flexed Gait velocity: decr   General Gait Details: trunk flexes forward intermitently, patient self corrects  Stairs            Wheelchair Mobility    Modified Rankin (Stroke  Patients Only)       Balance Overall balance assessment: Needs assistance Sitting-balance support: Feet supported Sitting balance-Leahy Scale: Good     Standing balance support: Single extremity supported;During functional activity Standing balance-Leahy Scale: Fair Standing balance comment: pt tends to move with forward flexed posture making him mildly unsteady, improved stability with single UE support and minA                             Pertinent Vitals/Pain Pain Assessment: Faces Faces Pain Scale: Hurts little more Pain Location: bac Pain Descriptors / Indicators: Discomfort Pain Intervention(s): Monitored during session    Home Living Family/patient expects to be discharged to:: Private residence Living Arrangements: Spouse/significant other Available Help at Discharge: Family Type of Home: House Home Access: Stairs to enter Entrance Stairs-Rails: Can reach both Entrance Stairs-Number of Steps: 5 Home Layout: One level Home Equipment: None      Prior Function Level of Independence: Independent         Comments: works     Journalist, newspaper        Extremity/Trunk Assessment   Upper Extremity Assessment Upper Extremity Assessment: Generalized weakness    Lower Extremity Assessment Lower Extremity Assessment: Generalized weakness    Cervical / Trunk Assessment Cervical / Trunk Assessment: Kyphotic(as well as forward flexion of trunk)  Communication   Communication: No difficulties;HOH  Cognition Arousal/Alertness: Awake/alert Behavior During Therapy: WFL for tasks assessed/performed Overall Cognitive Status: Impaired/Different from baseline Area of Impairment: Safety/judgement;Awareness;Problem solving  Safety/Judgement: Decreased awareness of deficits Awareness: Emergent Problem Solving: Slow processing;Decreased initiation General Comments: pt noted with delayed processing and intermittently requiring  cues for safety, initiating tasks; pt also mildly HOH so may add to current presentation      General Comments General comments (skin integrity, edema, etc.): pt on 2L O2 with SpO2 >/=88%    Exercises     Assessment/Plan    PT Assessment Patient needs continued PT services  PT Problem List Decreased strength;Decreased mobility;Decreased safety awareness;Decreased knowledge of precautions;Decreased activity tolerance;Decreased cognition;Decreased knowledge of use of DME       PT Treatment Interventions DME instruction;Therapeutic activities;Gait training;Therapeutic exercise;Patient/family education;Functional mobility training    PT Goals (Current goals can be found in the Care Plan section)  Acute Rehab PT Goals Patient Stated Goal: regain independence PT Goal Formulation: With patient Time For Goal Achievement: 03/24/19 Potential to Achieve Goals: Good    Frequency Min 3X/week   Barriers to discharge        Co-evaluation PT/OT/SLP Co-Evaluation/Treatment: Yes Reason for Co-Treatment: For patient/therapist safety PT goals addressed during session: Mobility/safety with mobility OT goals addressed during session: ADL's and self-care       AM-PAC PT "6 Clicks" Mobility  Outcome Measure Help needed turning from your back to your side while in a flat bed without using bedrails?: None Help needed moving from lying on your back to sitting on the side of a flat bed without using bedrails?: None Help needed moving to and from a bed to a chair (including a wheelchair)?: A Little Help needed standing up from a chair using your arms (e.g., wheelchair or bedside chair)?: A Little Help needed to walk in hospital room?: A Little Help needed climbing 3-5 steps with a railing? : A Lot 6 Click Score: 19    End of Session   Activity Tolerance: Patient tolerated treatment well Patient left: in chair;with call bell/phone within reach Nurse Communication: Mobility status PT Visit  Diagnosis: Unsteadiness on feet (R26.81);Difficulty in walking, not elsewhere classified (R26.2)    Time: [id number] PT Time Calculation (min) (ACUTE ONLY): 28 min   Charges:   PT Evaluation $PT Eval Low Complexity: [place] PT Acute Rehabilitation Services Pager [phone] Office [phone]   Claretha Cooper 03/10/2019, 12:37 PM

## 2019-03-10 NOTE — Progress Notes (Signed)
Initial Nutrition Assessment  DOCUMENTATION CODES:   Not applicable  INTERVENTION:   Ensure Enlive po BID, each supplement provides 350 kcal and 20 grams of protein  Encourage PO intake    NUTRITION DIAGNOSIS:   Increased nutrient needs related to acute illness(COVID-19 PNA) as evidenced by estimated needs.  GOAL:   Patient will meet greater than or equal to 90% of their needs  MONITOR:   PO intake, Supplement acceptance  REASON FOR ASSESSMENT:   Malnutrition Screening Tool    ASSESSMENT:   Pt with PMH of HTN, GERD, HLD who was dx with COVID-19 1 week PTA admitted with acute hypoxic respiratory failure.   Meal Completion: 50%  Pt remains on steroids and remdesivir  2L O2 via [place]  Medications reviewed and include: vitamin C, decadron, vitamin B12, Vitamin D, zinc Labs: Na 131 (L)  NUTRITION - FOCUSED PHYSICAL EXAM:  Deferred   Diet Order:   Diet Order            Diet Heart Room service appropriate? Yes; Fluid consistency: Thin  Diet effective now              EDUCATION NEEDS:   No education needs have been identified at this time  Skin:  Skin Assessment: Reviewed RN Assessment  Last BM:  12/21  Height:   Ht Readings from Last 1 Encounters:  03/09/19 5\' 8"  (1.727 m)    Weight:   Wt Readings from Last 1 Encounters:  03/09/19 72.1 kg    Ideal Body Weight:  70 kg  BMI:  Body mass index is 24.17 kg/m.  Estimated Nutritional Needs:   Kcal:  2160-2500  Protein:  105-115 grams  Fluid:  >2L/day  Maylon Peppers RD, [place], CNSC [phone] Pager [phone] After Hours Pager

## 2019-03-10 NOTE — Progress Notes (Signed)
Patient arrived to room 306 via wheelchair, internally transferred to medical floor here at [facility].  Patient A&Ox4, on 2L o2 via [place].  SBAx1 to bed.  Bilateral hearing aids noted, both in place, in use by patient.  Patient oriented to room, use of call light, remote, repositioning in bed.  Encouraged to continue hourly use of IS and flutter valve.  Patient verbalized and demonstrates appropriate use of above teaching.  VSS.    03/10/19 1818  Vitals  Temp 98.1 F (36.7 C)  Temp Source Oral  BP 126/67  MAP (mmHg) 84  BP Location Left Arm  BP Method Automatic  Patient Position (if appropriate) Lying  Pulse Rate 65  Pulse Rate Source Monitor  Resp 17  Level of Consciousness  Level of Consciousness Alert  Oxygen Therapy  SpO2 91 %  O2 Device Nasal Cannula  O2 Flow Rate (L/min) 2 L/min  Patient Activity (if Appropriate) In bed  Pulse Oximetry Type Intermittent  Pain Assessment  Pain Scale 0-10  Pain Score 0  MEWS Score  MEWS RR 0  MEWS Pulse 0  MEWS Systolic 0  MEWS LOC 0  MEWS Temp 0  MEWS Score 0  MEWS Score Color Nyoka Cowden

## 2019-03-10 NOTE — Progress Notes (Signed)
PROGRESS NOTE                                                                                                                                                                                                             Patient Demographics:    Jeffery Payne, is a 68 y.o. male, DOB - 07-15-1950, [id number]  Outpatient Primary MD for the patient is [name]., MD    LOS - 1  Admit date - 03/09/2019    CC - SOB     Brief Narrative 68 year old Caucasian man with history of hypertension, GERD, dyslipidemia who was diagnosed with COVID-19 infection about 1 week before hospital admission presented to the ER with shortness of breath, he was found to be in acute hypoxic respiratory failure and admitted to [facility].   Subjective:    Patient in bed, appears comfortable, denies any headache, no fever, no chest pain or pressure, +ve cough but no shortness of breath , no abdominal pain. No focal weakness.    Assessment  & Plan :     1. Acute Hypoxic Resp. Failure due to Acute Covid 19 Viral Pneumonitis during the ongoing 2020 Covid 19 Pandemic - he is has moderate disease, now in no distress and gradually improving oxygen demand, cough is improved with supportive care, has been started on steroids and remdesivir which will be continued.  Continue to monitor closely, continue IV steroids for now and monitor inflammatory markers closely.  Encouraged the patient to sit up in chair in the daytime use I-S and flutter valve for pulmonary toiletry and then prone in bed when at night.  SpO2: 93 % O2 Flow Rate (L/min): 2 L/min  Recent Labs  Lab 03/09/19 0711 03/09/19 0712 03/09/19 1030 03/10/19 0202  CRP 6.9*  --   --  6.7*  DDIMER  --  3.81*  --  3.21*  BNP  --   --  75.8 204.2*    Hepatic Function Latest Ref Rng & Units 03/10/2019 03/09/2019 07/04/2018  Total Protein 6.5 - 8.1 g/dL 5.5(L) 5.5(L) 7.0  Albumin 3.5 - 5.0 g/dL 2.8(L)  2.9(L) 4.5  AST 15 - 41 U/L 33 33 35  ALT 0 - 44 U/L 19 18 34  Alk Phosphatase 38 - 126 U/L 42 39 45  Total Bilirubin 0.3 - 1.2 mg/dL 0.8 0.6 0.8  2.  Essential hypertension - he has been started on combination of Norvasc and beta-blocker.  Will monitor and adjust.  3.  Hyponatremia.  Could be due to dehydration, check urine electrolytes, gentle hydration and monitor.  4.  GERD.  PPI.  5.  Dyslipidemia.  Resume statin.  6.  Viral illness induced leukopenia.  Stable.    Condition - Fair  Family Communication  :  Wife - 03/09/19  Code Status : Full  Diet :   Diet Order            Diet Heart Room service appropriate? Yes; Fluid consistency: Thin  Diet effective now               Disposition Plan  :  TBD  Consults  :  None  Procedures  :    PUD Prophylaxis : PPI  DVT Prophylaxis  :  Lovenox   Lab Results  Component Value Date   PLT 193 03/10/2019    Inpatient Medications  Scheduled Meds: . amLODipine  10 mg Oral QHS  . vitamin C  500 mg Oral Daily  . aspirin EC  81 mg Oral QHS  . chlorpheniramine-HYDROcodone  5 mL Oral Q12H  . dexamethasone (DECADRON) injection  6 mg Intravenous Q24H  . enoxaparin (LOVENOX) injection  40 mg Subcutaneous Q12H  . insulin aspart  0-9 Units Subcutaneous TID WC  . metoprolol tartrate  50 mg Oral BID  . pantoprazole  40 mg Oral Daily  . pravastatin  40 mg Oral QHS  . vitamin B-12  500 mcg Oral QPM  . cholecalciferol  1,000 Units Oral Daily  . zinc sulfate  220 mg Oral Daily   Continuous Infusions: . remdesivir 100 mg in NS 100 mL 100 mg (03/10/19 0936)   PRN Meds:.acetaminophen, chlorpheniramine-HYDROcodone, dicyclomine, guaiFENesin-dextromethorphan, HYDROcodone-acetaminophen, ondansetron **OR** ondansetron (ZOFRAN) IV  Antibiotics  :    Anti-infectives (From admission, onward)   Start     Dose/Rate Route Frequency Ordered Stop   03/10/19 1000  remdesivir 100 mg in sodium chloride 0.9 % 100 mL IVPB  Status:   Discontinued     100 mg 200 mL/hr over 30 Minutes Intravenous Daily 03/09/19 0216 03/09/19 0218   03/09/19 1000  remdesivir 100 mg in sodium chloride 0.9 % 100 mL IVPB     100 mg 200 mL/hr over 30 Minutes Intravenous Daily 03/09/19 0224 03/13/19 0959   03/09/19 0230  remdesivir 200 mg in sodium chloride 0.9% 250 mL IVPB  Status:  Discontinued     200 mg 580 mL/hr over 30 Minutes Intravenous Once 03/09/19 0216 03/09/19 0218       Time Spent in minutes  30   Lala Lund M.D on 03/10/2019 at 10:19 AM  To page go to [email or web address] - password [facility]  [facility] -  Office  [phone]   See all Orders from today for further details    Objective:   Vitals:   03/10/19 0003 03/10/19 0434 03/10/19 0726 03/10/19 0731  BP:  126/68 109/61   Pulse:  (!) 58 (!) 55   Resp:   20   Temp:  98 F (36.7 C) 98.6 F (37 C)   TempSrc:  Oral Oral   SpO2: (S) 92% 92% 92% 93%  Weight:      Height:        Wt Readings from Last 3 Encounters:  03/09/19 72.1 kg  07/24/18 70.3 kg  07/10/18 74.8 kg     Intake/Output Summary (Last 24  hours) at 03/10/2019 1019 Last data filed at 03/10/2019 0000 Gross per 24 hour  Intake 1589.26 ml  Output 300 ml  Net 1289.26 ml     Physical Exam  Awake Alert,  No new F.N deficits, Normal affect [place].AT,PERRAL Supple Neck,No JVD, No cervical lymphadenopathy appriciated.  Symmetrical Chest wall movement, Good air movement bilaterally, CTAB RRR,No Gallops, Rubs or new Murmurs, No Parasternal Heave +ve B.Sounds, Abd Soft, No tenderness, No organomegaly appriciated, No rebound - guarding or rigidity. No Cyanosis, Clubbing or edema, No new Rash or bruise    Data Review:    CBC Recent Labs  Lab 03/09/19 0712 03/10/19 0202  WBC 3.3* 2.1*  HGB 12.0* 12.9*  HCT 35.0* 37.6*  PLT 166 193  MCV 92.8 92.6  MCH 31.8 31.8  MCHC 34.3 34.3  RDW 13.5 13.6  LYMPHSABS 0.7 0.5*  MONOABS 0.3 0.1  EOSABS 0.0 0.0  BASOSABS 0.0 0.0    Chemistries    Recent Labs  Lab 03/09/19 0712 03/10/19 0202  NA 130* 131*  K 3.8 4.6  CL 94* 94*  CO2 25 24  GLUCOSE 107* 129*  BUN 11 15  CREATININE 0.56* 0.50*  CALCIUM 7.9* 8.2*  MG 2.0 2.1  AST 33 33  ALT 18 19  ALKPHOS 39 42  BILITOT 0.6 0.8   ------------------------------------------------------------------------------------------------------------------ No results for input(s): CHOL, HDL, LDLCALC, TRIG, CHOLHDL, LDLDIRECT in the last 72 hours.  Lab Results  Component Value Date   HGBA1C 6.2 (H) 03/09/2019   ------------------------------------------------------------------------------------------------------------------ No results for input(s): TSH, T4TOTAL, T3FREE, THYROIDAB in the last 72 hours.  Invalid input(s): FREET3  Cardiac Enzymes No results for input(s): CKMB, TROPONINI, MYOGLOBIN in the last 168 hours.  Invalid input(s): CK ------------------------------------------------------------------------------------------------------------------    Component Value Date/Time   BNP 204.2 (H) 03/10/2019 0202    Micro Results No results found for this or any previous visit (from the past 240 hour(s)).  Radiology Reports No results found.

## 2019-03-10 NOTE — Evaluation (Signed)
Occupational Therapy Evaluation Patient Details Name: ROCHELL VAZZANA MRN: [id number] DOB: 24-Oct-1950 Today's Date: 03/10/2019    History of Present Illness 68 year old Caucasian man with history of hypertension, GERD, dyslipidemia who was diagnosed with COVID-19 infection about 1 week before hospital admission presented to the ER with shortness of breath, he was found to be in acute hypoxic respiratory failure and admitted to [facility].   Clinical Impression   This 68 y/o male presents with the above. PTA pt living with spouse, reports independence with ADL, iADL and functional mobility; was working. Pt currently presenting with weakness, decreased activity tolerance and impaired standing balance. Pt currently requiring minA for functional mobility (HHA), in room and short distance in hallway. Pt tends to move with a forward flexed posture causing increased unsteadiness, pt able to correct posture but difficult to maintain. He currently requires minA for LB ADL, close minguard assist for standing grooming ADL. Pt on 2L during session with SpO2 >/=88%. He will benefit from continued acute OT services and recommend follow up [facility] services after discharge to maximize his safety and independence with ADL and mobility. Will follow.     Follow Up Recommendations  Home health OT;Supervision/Assistance - 24 hour    Equipment Recommendations  3 in 1 bedside commode(for use in shower)           Precautions / Restrictions Precautions Precautions: Fall Precaution Comments: monitor sats Restrictions Weight Bearing Restrictions: No      Mobility Bed Mobility Overal bed mobility: Needs Assistance Bed Mobility: Supine to Sit     Supine to sit: Min guard     General bed mobility comments: for lines and safety   Transfers Overall transfer level: Needs assistance Equipment used: None Transfers: Sit to/from Stand Sit to Stand: Min guard         General transfer comment: for lines and  balance    Balance Overall balance assessment: Needs assistance Sitting-balance support: Feet supported Sitting balance-Leahy Scale: Good     Standing balance support: Single extremity supported;During functional activity Standing balance-Leahy Scale: Fair Standing balance comment: pt tends to move with forward flexed posture making him mildly unsteady, improved stability with single UE support and minA                           ADL either performed or assessed with clinical judgement   ADL Overall ADL's : Needs assistance/impaired Eating/Feeding: Modified independent;Sitting   Grooming: Oral care;Min guard;Standing Grooming Details (indicate cue type and reason): close minguard for standing balance Upper Body Bathing: Min guard;Set up;Sitting   Lower Body Bathing: Minimal assistance;Sit to/from stand   Upper Body Dressing : Set up;Min guard;Sitting   Lower Body Dressing: Minimal assistance;Sit to/from stand   Toilet Transfer: Minimal assistance;Ambulation   Toileting- Clothing Manipulation and Hygiene: Minimal assistance;Sit to/from stand       Functional mobility during ADLs: Minimal assistance General ADL Comments: pt tends to mobilize with forward flexed posture, able to correct but not fully able to maintain      Vision         Perception     Praxis      Pertinent Vitals/Pain Pain Assessment: No/denies pain     Hand Dominance     Extremity/Trunk Assessment Upper Extremity Assessment Upper Extremity Assessment: Generalized weakness   Lower Extremity Assessment Lower Extremity Assessment: Defer to PT evaluation   Cervical / Trunk Assessment Cervical / Trunk Assessment: Kyphotic   Communication Communication  Communication: No difficulties;HOH   Cognition Arousal/Alertness: Awake/alert Behavior During Therapy: WFL for tasks assessed/performed Overall Cognitive Status: Impaired/Different from baseline Area of Impairment:  Safety/judgement;Awareness;Problem solving                         Safety/Judgement: Decreased awareness of deficits Awareness: Emergent Problem Solving: Slow processing;Decreased initiation General Comments: pt noted with delayed processing and intermittently requiring cues for safety, initiating tasks; pt also mildly HOH so may add to current presentation   General Comments  pt on 2L O2 with SpO2 >/=88%    Exercises     Shoulder Instructions      Home Living Family/patient expects to be discharged to:: Private residence Living Arrangements: Spouse/significant other Available Help at Discharge: Family Type of Home: House Home Access: Stairs to enter Technical brewer of Steps: 5 Entrance Stairs-Rails: Can reach both Home Layout: One level     Bathroom Shower/Tub: Tub/shower unit;Walk-in shower   Bathroom Toilet: Handicapped height     Home Equipment: None          Prior Functioning/Environment Level of Independence: Independent        Comments: works        OT Problem List: Decreased strength;Decreased activity tolerance;Impaired balance (sitting and/or standing);Decreased safety awareness;Decreased knowledge of use of DME or AE;Cardiopulmonary status limiting activity      OT Treatment/Interventions: Self-care/ADL training;Therapeutic exercise;Energy conservation;DME and/or AE instruction;Therapeutic activities;Patient/family education;Balance training;Cognitive remediation/compensation    OT Goals(Current goals can be found in the care plan section) Acute Rehab OT Goals Patient Stated Goal: regain independence OT Goal Formulation: With patient Time For Goal Achievement: 03/24/19 Potential to Achieve Goals: Good  OT Frequency: Min 2X/week   Barriers to D/C:            Co-evaluation PT/OT/SLP Co-Evaluation/Treatment: Yes Reason for Co-Treatment: For patient/therapist safety   OT goals addressed during session: ADL's and self-care       AM-PAC OT "6 Clicks" Daily Activity     Outcome Measure Help from another person eating meals?: None Help from another person taking care of personal grooming?: A Little Help from another person toileting, which includes using toliet, bedpan, or urinal?: A Little Help from another person bathing (including washing, rinsing, drying)?: A Little Help from another person to put on and taking off regular upper body clothing?: None Help from another person to put on and taking off regular lower body clothing?: A Little 6 Click Score: 20   End of Session Equipment Utilized During Treatment: Oxygen Nurse Communication: Mobility status  Activity Tolerance: Patient tolerated treatment well Patient left: in chair;with call bell/phone within reach  OT Visit Diagnosis: Unsteadiness on feet (R26.81);Muscle weakness (generalized) (M62.81)                Time: [id number] OT Time Calculation (min): 28 min Charges:  OT General Charges $OT Visit: 1 Visit OT Evaluation $OT Eval Moderate Complexity: [place], OT [employer] Pager [phone] Office [phone]  Raymondo Band 03/10/2019, 11:16 AM

## 2019-03-10 NOTE — Plan of Care (Signed)

## 2019-03-11 LAB — CBC WITH DIFFERENTIAL/PLATELET
Abs Immature Granulocytes: 0.03 10*3/uL (ref 0.00–0.07)
Basophils Absolute: 0 10*3/uL (ref 0.0–0.1)
Basophils Relative: 0 %
Eosinophils Absolute: 0 10*3/uL (ref 0.0–0.5)
Eosinophils Relative: 0 %
HCT: 36.9 % — ABNORMAL LOW (ref 39.0–52.0)
Hemoglobin: 12.8 g/dL — ABNORMAL LOW (ref 13.0–17.0)
Immature Granulocytes: 1 %
Lymphocytes Relative: 14 %
Lymphs Abs: 0.5 10*3/uL — ABNORMAL LOW (ref 0.7–4.0)
MCH: 32.2 pg (ref 26.0–34.0)
MCHC: 34.7 g/dL (ref 30.0–36.0)
MCV: 92.9 fL (ref 80.0–100.0)
Monocytes Absolute: 0.2 10*3/uL (ref 0.1–1.0)
Monocytes Relative: 5 %
Neutro Abs: 2.9 10*3/uL (ref 1.7–7.7)
Neutrophils Relative %: 80 %
Platelets: 241 10*3/uL (ref 150–400)
RBC: 3.97 MIL/uL — ABNORMAL LOW (ref 4.22–5.81)
RDW: 14.5 % (ref 11.5–15.5)
WBC: 3.6 10*3/uL — ABNORMAL LOW (ref 4.0–10.5)
nRBC: 0 % (ref 0.0–0.2)

## 2019-03-11 LAB — COMPREHENSIVE METABOLIC PANEL
ALT: 21 U/L (ref 0–44)
AST: 28 U/L (ref 15–41)
Albumin: 2.7 g/dL — ABNORMAL LOW (ref 3.5–5.0)
Alkaline Phosphatase: 44 U/L (ref 38–126)
Anion gap: 8 (ref 5–15)
BUN: 18 mg/dL (ref 8–23)
CO2: 27 mmol/L (ref 22–32)
Calcium: 8.3 mg/dL — ABNORMAL LOW (ref 8.9–10.3)
Chloride: 98 mmol/L (ref 98–111)
Creatinine, Ser: 0.6 mg/dL — ABNORMAL LOW (ref 0.61–1.24)
GFR calc Af Amer: >60 mL/min (ref >60)
GFR calc non Af Amer: >60 mL/min (ref >60)
Glucose, Bld: 169 mg/dL — ABNORMAL HIGH (ref 70–99)
Potassium: 4.7 mmol/L (ref 3.5–5.1)
Sodium: 133 mmol/L — ABNORMAL LOW (ref 135–145)
Total Bilirubin: 0.4 mg/dL (ref 0.3–1.2)
Total Protein: 5.5 g/dL — ABNORMAL LOW (ref 6.5–8.1)

## 2019-03-11 LAB — GLUCOSE, CAPILLARY
Glucose-Capillary: 119 mg/dL — ABNORMAL HIGH (ref 70–99)
Glucose-Capillary: 138 mg/dL — ABNORMAL HIGH (ref 70–99)
Glucose-Capillary: 151 mg/dL — ABNORMAL HIGH (ref 70–99)
Glucose-Capillary: 135 mg/dL — ABNORMAL HIGH (ref 70–99)

## 2019-03-11 LAB — BRAIN NATRIURETIC PEPTIDE: B Natriuretic Peptide: 81.4 pg/mL (ref 0.0–100.0)

## 2019-03-11 LAB — D-DIMER, QUANTITATIVE: D-Dimer, Quant: 2.29 ug/mL-FEU — ABNORMAL HIGH (ref 0.00–0.50)

## 2019-03-11 LAB — C-REACTIVE PROTEIN: CRP: 3.4 mg/dL — ABNORMAL HIGH (ref <1.0)

## 2019-03-11 LAB — MAGNESIUM: Magnesium: 2.1 mg/dL (ref 1.7–2.4)

## 2019-03-11 MED ORDER — FUROSEMIDE 10 MG/ML IJ SOLN
40.0000 mg | Freq: Once | INTRAMUSCULAR | Status: AC
  Administered 2019-03-11: 40 mg via INTRAVENOUS
  Filled 2019-03-11: qty 4

## 2019-03-11 MED ORDER — DEXAMETHASONE SODIUM PHOSPHATE 10 MG/ML IJ SOLN
4.0000 mg | INTRAMUSCULAR | Status: DC
  Administered 2019-03-11: 4 mg via INTRAVENOUS
  Filled 2019-03-11: qty 1

## 2019-03-11 NOTE — Progress Notes (Signed)
PROGRESS NOTE                                                                                                                                                                                                             Patient Demographics:    Jeffery Payne, is a 68 y.o. male, DOB - April 19, 1950, [id number]  Outpatient Primary MD for the patient is [name]., MD    LOS - 2  Admit date - 03/09/2019    CC - SOB     Brief Narrative 68 year old Caucasian man with history of hypertension, GERD, dyslipidemia who was diagnosed with COVID-19 infection about 1 week before hospital admission presented to the ER with shortness of breath, he was found to be in acute hypoxic respiratory failure and admitted to [facility].   Subjective:   Patient in bed, appears comfortable, denies any headache, no fever, no chest pain or pressure, no shortness of breath , no abdominal pain. No focal weakness.   Assessment  & Plan :     1. Acute Hypoxic Resp. Failure due to Acute Covid 19 Viral Pneumonitis during the ongoing 2020 Covid 19 Pandemic - he is has moderate disease, now in no distress and gradually improving oxygen demand, cough is improved with supportive care, has been started on steroids and remdesivir which will be continued.  Continue to monitor closely, continue IV steroids for now and monitor inflammatory markers closely.  Encouraged the patient to sit up in chair in the daytime use I-S and flutter valve for pulmonary toiletry and then prone in bed when at night.  SpO2: 94 % O2 Flow Rate (L/min): 2 L/min  Recent Labs  Lab 03/09/19 0711 03/09/19 0712 03/09/19 1030 03/10/19 0202 03/11/19 0213  CRP 6.9*  --   --  6.7* 3.4*  DDIMER  --  3.81*  --  3.21* 2.29*  BNP  --   --  75.8 204.2* 81.4    Hepatic Function Latest Ref Rng & Units 03/11/2019 03/10/2019 03/09/2019  Total Protein 6.5 - 8.1 g/dL 5.5(L) 5.5(L) 5.5(L)  Albumin 3.5  - 5.0 g/dL 2.7(L) 2.8(L) 2.9(L)  AST 15 - 41 U/L 28 33 33  ALT 0 - 44 U/L '21 19 18  ' Alk Phosphatase 38 - 126 U/L 44 42 39  Total Bilirubin 0.3 - 1.2 mg/dL 0.4 0.8 0.6  2.  Essential hypertension - he has been started on combination of Norvasc , stopped Lopressor due to Bradycardia.  3. Hyponatremia -  Now with rales, Lasix now.  4.  GERD.  PPI.  5.  Dyslipidemia.  Resume statin.  6.  Viral illness induced leukopenia.  Stable.    Condition - Fair  Family Communication  :  Wife - 03/09/19  Code Status : Full  Diet :   Diet Order            Diet Heart Room service appropriate? Yes; Fluid consistency: Thin  Diet effective now               Disposition Plan  :  TBD  Consults  :  None  Procedures  :    PUD Prophylaxis : PPI  DVT Prophylaxis  :  Lovenox   Lab Results  Component Value Date   PLT 241 03/11/2019    Inpatient Medications  Scheduled Meds: . amLODipine  10 mg Oral QHS  . vitamin C  500 mg Oral Daily  . aspirin EC  81 mg Oral QHS  . dexamethasone (DECADRON) injection  4 mg Intravenous Q24H  . enoxaparin (LOVENOX) injection  40 mg Subcutaneous Q12H  . feeding supplement (ENSURE ENLIVE)  237 mL Oral BID BM  . furosemide  40 mg Intravenous Once  . insulin aspart  0-9 Units Subcutaneous TID WC  . pantoprazole  40 mg Oral Daily  . pravastatin  40 mg Oral QHS  . vitamin B-12  500 mcg Oral QPM  . cholecalciferol  1,000 Units Oral Daily  . zinc sulfate  220 mg Oral Daily   Continuous Infusions: . remdesivir 100 mg in NS 100 mL Stopped (03/10/19 1006)   PRN Meds:.acetaminophen, chlorpheniramine-HYDROcodone, dicyclomine, guaiFENesin-dextromethorphan, ondansetron **OR** ondansetron (ZOFRAN) IV  Antibiotics  :    Anti-infectives (From admission, onward)   Start     Dose/Rate Route Frequency Ordered Stop   03/10/19 1000  remdesivir 100 mg in sodium chloride 0.9 % 100 mL IVPB  Status:  Discontinued     100 mg 200 mL/hr over 30 Minutes  Intravenous Daily 03/09/19 0216 03/09/19 0218   03/09/19 1000  remdesivir 100 mg in sodium chloride 0.9 % 100 mL IVPB     100 mg 200 mL/hr over 30 Minutes Intravenous Daily 03/09/19 0224 03/13/19 0959   03/09/19 0230  remdesivir 200 mg in sodium chloride 0.9% 250 mL IVPB  Status:  Discontinued     200 mg 580 mL/hr over 30 Minutes Intravenous Once 03/09/19 0216 03/09/19 0218       Time Spent in minutes  30   Lala Lund M.D on 03/11/2019 at 9:34 AM  To page go to [email or web address] - password [email or web address]  [facility] -  Office  [phone]   See all Orders from today for further details    Objective:   Vitals:   03/10/19 1818 03/10/19 2000 03/11/19 0353 03/11/19 0700  BP: 126/67 (!) 133/56 113/63 114/67  Pulse: 65 62 (!) 50 (!) 46  Resp: '17 18 18 18  ' Temp: 98.1 F (36.7 C) 97.9 F (36.6 C) 98.1 F (36.7 C) 97.6 F (36.4 C)  TempSrc: Oral Oral Oral Oral  SpO2: 91% 92% 93% 94%  Weight:      Height:        Wt Readings from Last 3 Encounters:  03/09/19 72.1 kg  07/24/18 70.3 kg  07/10/18 74.8 kg     Intake/Output Summary (Last 24 hours)  at 03/11/2019 0934 Last data filed at 03/10/2019 2100 Gross per 24 hour  Intake 960 ml  Output 1950 ml  Net -990 ml     Physical Exam  Awake Alert,  Oriented X 3, No new F.N deficits, Normal affect [place].AT,PERRAL Supple Neck,No JVD, No cervical lymphadenopathy appriciated.  Symmetrical Chest wall movement, Good air movement bilaterally, +ve rales RRR,No Gallops, Rubs or new Murmurs, No Parasternal Heave +ve B.Sounds, Abd Soft, No tenderness, No organomegaly appriciated, No rebound - guarding or rigidity. No Cyanosis, Clubbing or edema, No new Rash or bruise     Data Review:    CBC Recent Labs  Lab 03/09/19 0712 03/10/19 0202 03/11/19 0213  WBC 3.3* 2.1* 3.6*  HGB 12.0* 12.9* 12.8*  HCT 35.0* 37.6* 36.9*  PLT 166 193 241  MCV 92.8 92.6 92.9  MCH 31.8 31.8 32.2  MCHC 34.3 34.3 34.7  RDW 13.5 13.6 14.5    LYMPHSABS 0.7 0.5* 0.5*  MONOABS 0.3 0.1 0.2  EOSABS 0.0 0.0 0.0  BASOSABS 0.0 0.0 0.0    Chemistries  Recent Labs  Lab 03/09/19 0712 03/10/19 0202 03/11/19 0213  NA 130* 131* 133*  K 3.8 4.6 4.7  CL 94* 94* 98  CO2 '25 24 27  ' GLUCOSE 107* 129* 169*  BUN '11 15 18  ' CREATININE 0.56* 0.50* 0.60*  CALCIUM 7.9* 8.2* 8.3*  MG 2.0 2.1 2.1  AST 33 33 28  ALT '18 19 21  ' ALKPHOS 39 42 44  BILITOT 0.6 0.8 0.4   ------------------------------------------------------------------------------------------------------------------ No results for input(s): CHOL, HDL, LDLCALC, TRIG, CHOLHDL, LDLDIRECT in the last 72 hours.  Lab Results  Component Value Date   HGBA1C 6.2 (H) 03/09/2019   ------------------------------------------------------------------------------------------------------------------ No results for input(s): TSH, T4TOTAL, T3FREE, THYROIDAB in the last 72 hours.  Invalid input(s): FREET3  Cardiac Enzymes No results for input(s): CKMB, TROPONINI, MYOGLOBIN in the last 168 hours.  Invalid input(s): CK ------------------------------------------------------------------------------------------------------------------    Component Value Date/Time   BNP 81.4 03/11/2019 0213    Micro Results No results found for this or any previous visit (from the past 240 hour(s)).  Radiology Reports No results found.

## 2019-03-12 LAB — COMPREHENSIVE METABOLIC PANEL
ALT: 36 U/L (ref 0–44)
AST: 47 U/L — ABNORMAL HIGH (ref 15–41)
Albumin: 2.6 g/dL — ABNORMAL LOW (ref 3.5–5.0)
Alkaline Phosphatase: 43 U/L (ref 38–126)
Anion gap: 10 (ref 5–15)
BUN: 22 mg/dL (ref 8–23)
CO2: 26 mmol/L (ref 22–32)
Calcium: 8.2 mg/dL — ABNORMAL LOW (ref 8.9–10.3)
Chloride: 96 mmol/L — ABNORMAL LOW (ref 98–111)
Creatinine, Ser: 0.62 mg/dL (ref 0.61–1.24)
GFR calc Af Amer: >60 mL/min (ref >60)
GFR calc non Af Amer: >60 mL/min (ref >60)
Glucose, Bld: 148 mg/dL — ABNORMAL HIGH (ref 70–99)
Potassium: 4.4 mmol/L (ref 3.5–5.1)
Sodium: 132 mmol/L — ABNORMAL LOW (ref 135–145)
Total Bilirubin: 0.6 mg/dL (ref 0.3–1.2)
Total Protein: 5.3 g/dL — ABNORMAL LOW (ref 6.5–8.1)

## 2019-03-12 LAB — CBC WITH DIFFERENTIAL/PLATELET
Abs Immature Granulocytes: 0.04 10*3/uL (ref 0.00–0.07)
Basophils Absolute: 0 10*3/uL (ref 0.0–0.1)
Basophils Relative: 0 %
Eosinophils Absolute: 0 10*3/uL (ref 0.0–0.5)
Eosinophils Relative: 0 %
HCT: 36.1 % — ABNORMAL LOW (ref 39.0–52.0)
Hemoglobin: 12.3 g/dL — ABNORMAL LOW (ref 13.0–17.0)
Immature Granulocytes: 1 %
Lymphocytes Relative: 7 %
Lymphs Abs: 0.4 10*3/uL — ABNORMAL LOW (ref 0.7–4.0)
MCH: 31.2 pg (ref 26.0–34.0)
MCHC: 34.1 g/dL (ref 30.0–36.0)
MCV: 91.6 fL (ref 80.0–100.0)
Monocytes Absolute: 0.3 10*3/uL (ref 0.1–1.0)
Monocytes Relative: 5 %
Neutro Abs: 5.2 10*3/uL (ref 1.7–7.7)
Neutrophils Relative %: 87 %
Platelets: 286 10*3/uL (ref 150–400)
RBC: 3.94 MIL/uL — ABNORMAL LOW (ref 4.22–5.81)
RDW: 13.4 % (ref 11.5–15.5)
WBC: 5.9 10*3/uL (ref 4.0–10.5)
nRBC: 0 % (ref 0.0–0.2)

## 2019-03-12 LAB — BRAIN NATRIURETIC PEPTIDE: B Natriuretic Peptide: 64.6 pg/mL (ref 0.0–100.0)

## 2019-03-12 LAB — MAGNESIUM: Magnesium: 2.1 mg/dL (ref 1.7–2.4)

## 2019-03-12 LAB — GLUCOSE, CAPILLARY
Glucose-Capillary: 122 mg/dL — ABNORMAL HIGH (ref 70–99)
Glucose-Capillary: 137 mg/dL — ABNORMAL HIGH (ref 70–99)

## 2019-03-12 LAB — D-DIMER, QUANTITATIVE: D-Dimer, Quant: 1.61 ug/mL-FEU — ABNORMAL HIGH (ref 0.00–0.50)

## 2019-03-12 LAB — C-REACTIVE PROTEIN: CRP: 1.6 mg/dL — ABNORMAL HIGH (ref <1.0)

## 2019-03-12 MED ORDER — METHYLPREDNISOLONE 4 MG PO TBPK: ORAL_TABLET | ORAL | 0 refills | Status: AC

## 2019-03-12 MED ORDER — FUROSEMIDE 10 MG/ML IJ SOLN
40.0000 mg | Freq: Once | INTRAMUSCULAR | Status: AC
  Administered 2019-03-12: 40 mg via INTRAVENOUS
  Filled 2019-03-12: qty 4

## 2019-03-12 MED ORDER — ALBUTEROL SULFATE HFA 108 (90 BASE) MCG/ACT IN AERS: 2.0000 | INHALATION_SPRAY | Freq: Four times a day (QID) | RESPIRATORY_TRACT | 0 refills | Status: AC | PRN

## 2019-03-12 NOTE — TOC Transition Note (Signed)
Transition of Care [facility]) - CM/SW Discharge Note   Patient Details  Name: NICHOLUS FERGUS MRN: [id number] Date of Birth: [birth year]  [facility] CM/SW Contact:  Shade Flood, LCSW Phone Number: 03/12/2019, 2:05 PM   Clinical Narrative:     Pt stable for dc today per MD. O2 and HH have been arranged. O2 concentrator is at pt's home. [facility] can start care on Monday. HH information added to pt's AVS. Discussed with RN. Pt's wife aware.  There are no other TOC needs for dc.   Final next level of care: [place] Barriers to Discharge: Barriers Resolved   Patient Goals and CMS Choice        Discharge Placement                       Discharge Plan and Services     Post Acute Care Choice: Home Health, Durable Medical Equipment          DME Arranged: Oxygen DME Agency: [place] Date DME Agency Contacted: 03/12/19   Representative spoke with at DME Agency: Magda Paganini HH Arranged: RN, PT [facility] Agency: [place] Date [place]: 03/12/19   Representative spoke with at [place]: [place] ([place] Interventions     Readmission Risk Interventions Readmission Risk Prevention Plan 03/12/2019  Medication Screening Complete  Transportation Screening Complete  Some recent data might be hidden

## 2019-03-12 NOTE — Discharge Summary (Addendum)
Jeffery Payne IAX:[id number] DOB: 1950/06/13 DOA: 03/09/2019  PCP: [name]., MD  Admit date: 03/09/2019  Discharge date: 03/12/2019  Admitted From: Home   Disposition:  Home   Recommendations for Outpatient Follow-up:   Follow up with PCP in 1-2 weeks  PCP Please obtain BMP/CBC, 2 view CXR in 1week,  (see Discharge instructions)   PCP Please follow up on the following pending results: Check CBC and CMP in 7 to 10 days time for follow-up.   Home Health: Pt, RN   Equipment/Devices: 2lit o2 PRN  Consultations: None  Discharge Condition: Stable    CODE STATUS: Full    Diet Recommendation: Heart Healthy   Diet Order            Diet - low sodium heart healthy        Diet Heart Room service appropriate? Yes; Fluid consistency: Thin  Diet effective now              Chief complaint.  Shortness of breath  Brief history of present illness from the day of admission and additional interim summary    68 year old Caucasian man with history of hypertension, GERD, dyslipidemia who was diagnosed with COVID-19 infection about 1 week before hospital admission presented to the ER with shortness of breath, he was found to be in acute hypoxic respiratory failure and admitted to [facility].                                                                 Hospital Course      1. Acute Hypoxic Resp. Failure due to Acute Covid 19 Viral Pneumonitis during the ongoing 2020 Covid 19 Pandemic - he had moderate disease, he was treated with IV steroids and remdesivir with good improvement, now at rest on room air, upon ambulation requires 1 to 2 L nasal cannula oxygen, relatively symptom-free and in no distress.  Will be discharged on oral steroid taper with outpatient PCP follow-up, home health PT RN will be arranged if accepted by  his insurance company.   Recent Labs  Lab 03/09/19 0711 03/09/19 0712 03/09/19 1030 03/10/19 0202 03/11/19 0213 03/12/19 0015  CRP 6.9*  --   --  6.7* 3.4* 1.6*  DDIMER  --  3.81*  --  3.21* 2.29* 1.61*  BNP  --   --  75.8 204.2* 81.4 64.6    Hepatic Function Latest Ref Rng & Units 03/12/2019 03/11/2019 03/10/2019  Total Protein 6.5 - 8.1 g/dL 5.3(L) 5.5(L) 5.5(L)  Albumin 3.5 - 5.0 g/dL 2.6(L) 2.7(L) 2.8(L)  AST 15 - 41 U/L 47(H) 28 33  ALT 0 - 44 U/L 36 21 19  Alk Phosphatase 38 - 126 U/L 43 44 42  Total Bilirubin 0.3 - 1.2 mg/dL 0.6 0.4 0.8    2.  Essential hypertension - he has been started on combination of Norvasc , stopped Lopressor due to Bradycardia.  3. Hyponatremia -  likely SIADH and mild fluid overload, improved after Lasix, PCP to repeat CMP in a week.  4.  GERD.  PPI.  5.  Dyslipidemia.  Resume statin.  6.  Viral illness induced leukopenia.  Stable.    Discharge diagnosis     Principal Problem:   Pneumonia due to COVID-19 virus Active Problems:   Essential hypertension   Hyperlipidemia   Type 2 diabetes mellitus without complication, without long-term current use of insulin (HCC)   AAA (abdominal aortic aneurysm) without rupture (HCC)   Acute respiratory failure due to COVID-19 [facility])    Discharge instructions    Discharge Instructions    Diet - low sodium heart healthy   Complete by: As directed    Discharge instructions   Complete by: As directed    Follow with Primary MD [name]., MD in 7 days   Get CBC, CMP, 2 view Chest X ray -  checked next visit within 1 week by Primary MD  Activity: As tolerated with Full fall precautions use walker/cane & assistance as needed  Disposition Home   Diet: Heart Healthy    Special Instructions: If you have smoked or chewed Tobacco  in the last 2 yrs please stop smoking, stop any regular Alcohol  and or any Recreational drug use.  On your next visit with your primary care physician  please Get Medicines reviewed and adjusted.  Please request your Prim.MD to go over all Hospital Tests and Procedure/Radiological results at the follow up, please get all Hospital records sent to your Prim MD by signing hospital release before you go home.  If you experience worsening of your admission symptoms, develop shortness of breath, life threatening emergency, suicidal or homicidal thoughts you must seek medical attention immediately by calling 911 or calling your MD immediately  if symptoms less severe.  You Must read complete instructions/literature along with all the possible adverse reactions/side effects for all the Medicines you take and that have been prescribed to you. Take any new Medicines after you have completely understood and accpet all the possible adverse reactions/side effects.   Increase activity slowly   Complete by: As directed    MyChart COVID-19 home monitoring program   Complete by: Mar 12, 2019    Is the patient willing to use the [place] for home monitoring?: Yes   Temperature monitoring   Complete by: Mar 12, 2019    After how many days would you like to receive a notification of this patient's flowsheet entries?: 1      Discharge Medications   Allergies as of 03/12/2019      Reactions   Ciprofloxacin Nausea Only   Severe nausea   Flagyl [metronidazole] Nausea Only   Severe nausea   Penicillin G Rash   Did it involve swelling of the face/tongue/throat, SOB, or low BP? Unk Did it involve sudden or severe rash/hives, skin peeling, or any reaction on the inside of your mouth or nose? Unk Did you need to seek medical attention at a hospital or doctor's office? Unk When did it last happen? From childhood If all above answers are "NO", may proceed with cephalosporin use.      Medication List    STOP taking these medications   azithromycin 500 MG tablet Commonly known as: ZITHROMAX   HYDROcodone-acetaminophen 5-325 MG tablet Commonly  known as: NORCO/VICODIN  ibuprofen 200 MG tablet Commonly known as: ADVIL     TAKE these medications   acetaminophen 325 MG tablet Commonly known as: TYLENOL Take 650 mg by mouth every 6 (six) hours as needed (for pain).   albuterol 108 (90 Base) MCG/ACT inhaler Commonly known as: VENTOLIN HFA Inhale 2 puffs into the lungs every 6 (six) hours as needed for wheezing or shortness of breath.   alum & mag hydroxide-simeth 200-200-20 MG/5ML suspension Commonly known as: MAALOX/MYLANTA Take 30 mLs by mouth every 4 (four) hours as needed for indigestion or heartburn.   amLODipine 10 MG tablet Commonly known as: NORVASC Take 10 mg by mouth at bedtime.   aspirin EC 81 MG tablet Take 81 mg by mouth at bedtime.   cholecalciferol 25 MCG (1000 UT) tablet Commonly known as: VITAMIN D3 Take 1,000 Units by mouth daily.   Dexilant 60 MG capsule Generic drug: dexlansoprazole Take 60 mg by mouth daily.   dicyclomine 10 MG capsule Commonly known as: BENTYL Take 10 mg by mouth 2 (two) times daily as needed.   ergocalciferol 1.25 MG (50000 UT) capsule Commonly known as: VITAMIN D2 Take 50,000 Units by mouth every 'Sunday.   guaiFENesin-dextromethorphan 100-10 MG/5ML syrup Commonly known as: ROBITUSSIN DM Take 10 mLs by mouth every 4 (four) hours as needed for cough.   losartan 100 MG tablet Commonly known as: COZAAR Take 100 mg by mouth at bedtime.   methylPREDNISolone 4 MG Tbpk tablet Commonly known as: MEDROL DOSEPAK follow package directions   metoprolol tartrate 25 MG tablet Commonly known as: LOPRESSOR Take 1 tablet (25 mg total) by mouth 2 (two) times daily.   ondansetron 4 MG tablet Commonly known as: ZOFRAN Take 4 mg by mouth every 6 (six) hours as needed for nausea or vomiting.   pravastatin 40 MG tablet Commonly known as: PRAVACHOL Take 40 mg by mouth at bedtime.   Vitamin B-12 2500 MCG Subl Place 2,500 mcg under the tongue every evening.              Durable Medical Equipment  (From admission, onward)         Start     Ordered   03/12/19 0947  For home use only DME oxygen  Once    Question Answer Comment  Length of Need 6 Months   Mode or (Route) Nasal cannula   Liters per Minute 2   Frequency Continuous (stationary and portable oxygen unit needed)   Oxygen conserving device Yes   Oxygen delivery system Gas      12' /23/20 0946          Follow-up Information    [name]., MD. Schedule an appointment as soon as possible for a visit in 1 week(s).   Specialty: Family Medicine Contact information: [place] [address] [phone]           Major procedures and Radiology Reports - PLEASE review detailed and final reports thoroughly  -        No results found.  Micro Results     No results found for this or any previous visit (from the past 240 hour(s)).  Today   Subjective    Jeffery Payne today has no headache,no chest abdominal pain,no new weakness tingling or numbness, feels much better wants to go home today.     Objective   Blood pressure 121/62, pulse (!) 53, temperature 97.6 F (36.4 C), temperature source Oral, resp. rate 18, height '5\' 8"'  (1.727  m), weight 72.1 kg, SpO2 97 %.   Intake/Output Summary (Last 24 hours) at 03/12/2019 0950 Last data filed at 03/12/2019 0515 Gross per 24 hour  Intake 120 ml  Output 950 ml  Net -830 ml    Exam  Awake Alert, Oriented x 3, No new F.N deficits, Normal affect [place].AT,PERRAL Supple Neck,No JVD, No cervical lymphadenopathy appriciated.  Symmetrical Chest wall movement, Good air movement bilaterally, CTAB RRR,No Gallops,Rubs or new Murmurs, No Parasternal Heave +ve B.Sounds, Abd Soft, Non tender, No organomegaly appriciated, No rebound -guarding or rigidity. No Cyanosis, Clubbing or edema, No new Rash or bruise   Data Review   CBC w Diff:  Lab Results  Component Value Date   WBC 5.9 03/12/2019   HGB 12.3 (L) 03/12/2019   HCT  36.1 (L) 03/12/2019   PLT 286 03/12/2019   LYMPHOPCT 7 03/12/2019   MONOPCT 5 03/12/2019   EOSPCT 0 03/12/2019   BASOPCT 0 03/12/2019    CMP:  Lab Results  Component Value Date   NA 132 (L) 03/12/2019   K 4.4 03/12/2019   CL 96 (L) 03/12/2019   CO2 26 03/12/2019   BUN 22 03/12/2019   CREATININE 0.62 03/12/2019   PROT 5.3 (L) 03/12/2019   ALBUMIN 2.6 (L) 03/12/2019   BILITOT 0.6 03/12/2019   ALKPHOS 43 03/12/2019   AST 47 (H) 03/12/2019   ALT 36 03/12/2019  .   Total Time in preparing paper work, data evaluation and todays exam - 42 minutes  Lala Lund M.D on 03/12/2019 at 9:50 AM  [facility]   Office  [phone]

## 2019-03-12 NOTE — Discharge Instructions (Signed)
Follow with Primary MD [name]., MD in 7 days   Get CBC, CMP, 2 view Chest X ray -  checked next visit within 1 week by Primary MD  Activity: As tolerated with Full fall precautions use walker/cane & assistance as needed  Disposition Home   Diet: Heart Healthy    Special Instructions: If you have smoked or chewed Tobacco  in the last 2 yrs please stop smoking, stop any regular Alcohol  and or any Recreational drug use.  On your next visit with your primary care physician please Get Medicines reviewed and adjusted.  Please request your Prim.MD to go over all Hospital Tests and Procedure/Radiological results at the follow up, please get all Hospital records sent to your Prim MD by signing hospital release before you go home.  If you experience worsening of your admission symptoms, develop shortness of breath, life threatening emergency, suicidal or homicidal thoughts you must seek medical attention immediately by calling 911 or calling your MD immediately  if symptoms less severe.  You Must read complete instructions/literature along with all the possible adverse reactions/side effects for all the Medicines you take and that have been prescribed to you. Take any new Medicines after you have completely understood and accpet all the possible adverse reactions/side effects.         Person Under Monitoring Name: Jeffery Payne  Location: [address] [place] [address]   Infection Prevention Recommendations for Individuals Confirmed to have, or Being Evaluated for, 2019 Novel Coronavirus (COVID-19) Infection Who Receive Care at Home  Individuals who are confirmed to have, or are being evaluated for, COVID-19 should follow the prevention steps below until a healthcare provider or local or state health department says they can return to normal activities.  Stay home except to get medical care You should restrict activities outside your home, except for getting medical care. Do  not go to work, school, or public areas, and do not use public transportation or taxis.  Call ahead before visiting your doctor Before your medical appointment, call the healthcare provider and tell them that you have, or are being evaluated for, COVID-19 infection. This will help the healthcare provider's office take steps to keep other people from getting infected. Ask your healthcare provider to call the local or state health department.  Monitor your symptoms Seek prompt medical attention if your illness is worsening (e.g., difficulty breathing). Before going to your medical appointment, call the healthcare provider and tell them that you have, or are being evaluated for, COVID-19 infection. Ask your healthcare provider to call the local or state health department.  Wear a facemask You should wear a facemask that covers your nose and mouth when you are in the same room with other people and when you visit a healthcare provider. People who live with or visit you should also wear a facemask while they are in the same room with you.  Separate yourself from other people in your home As much as possible, you should stay in a different room from other people in your home. Also, you should use a separate bathroom, if available.  Avoid sharing household items You should not share dishes, drinking glasses, cups, eating utensils, towels, bedding, or other items with other people in your home. After using these items, you should wash them thoroughly with soap and water.  Cover your coughs and sneezes Cover your mouth and nose with a tissue when you cough or sneeze, or you can cough or  sneeze into your sleeve. Throw used tissues in a lined trash can, and immediately wash your hands with soap and water for at least 20 seconds or use an alcohol-based hand rub.  Wash your [facility] your hands often and thoroughly with soap and water for at least 20 seconds. You can use an alcohol-based  hand sanitizer if soap and water are not available and if your hands are not visibly dirty. Avoid touching your eyes, nose, and mouth with unwashed hands.   Prevention Steps for Caregivers and Household Members of Individuals Confirmed to have, or Being Evaluated for, COVID-19 Infection Being Cared for in the Home  If you live with, or provide care at home for, a person confirmed to have, or being evaluated for, COVID-19 infection please follow these guidelines to prevent infection:  Follow healthcare provider's instructions Make sure that you understand and can help the patient follow any healthcare provider instructions for all care.  Provide for the patient's basic needs You should help the patient with basic needs in the home and provide support for getting groceries, prescriptions, and other personal needs.  Monitor the patient's symptoms If they are getting sicker, call his or her medical provider and tell them that the patient has, or is being evaluated for, COVID-19 infection. This will help the healthcare provider's office take steps to keep other people from getting infected. Ask the healthcare provider to call the local or state health department.  Limit the number of people who have contact with the patient  If possible, have only one caregiver for the patient.  Other household members should stay in another home or place of residence. If this is not possible, they should stay  in another room, or be separated from the patient as much as possible. Use a separate bathroom, if available.  Restrict visitors who do not have an essential need to be in the home.  Keep older adults, very young children, and other sick people away from the patient Keep older adults, very young children, and those who have compromised immune systems or chronic health conditions away from the patient. This includes people with chronic heart, lung, or kidney conditions, diabetes, and  cancer.  Ensure good ventilation Make sure that shared spaces in the home have good air flow, such as from an air conditioner or an opened window, weather permitting.  Wash your hands often  Wash your hands often and thoroughly with soap and water for at least 20 seconds. You can use an alcohol based hand sanitizer if soap and water are not available and if your hands are not visibly dirty.  Avoid touching your eyes, nose, and mouth with unwashed hands.  Use disposable paper towels to dry your hands. If not available, use dedicated cloth towels and replace them when they become wet.  Wear a facemask and gloves  Wear a disposable facemask at all times in the room and gloves when you touch or have contact with the patient's blood, body fluids, and/or secretions or excretions, such as sweat, saliva, sputum, nasal mucus, vomit, urine, or feces.  Ensure the mask fits over your nose and mouth tightly, and do not touch it during use.  Throw out disposable facemasks and gloves after using them. Do not reuse.  Wash your hands immediately after removing your facemask and gloves.  If your personal clothing becomes contaminated, carefully remove clothing and launder. Wash your hands after handling contaminated clothing.  Place all used disposable facemasks, gloves, and other waste  in a lined container before disposing them with other household waste.  Remove gloves and wash your hands immediately after handling these items.  Do not share dishes, glasses, or other household items with the patient  Avoid sharing household items. You should not share dishes, drinking glasses, cups, eating utensils, towels, bedding, or other items with a patient who is confirmed to have, or being evaluated for, COVID-19 infection.  After the person uses these items, you should wash them thoroughly with soap and water.  Wash laundry thoroughly  Immediately remove and wash clothes or bedding that have blood, body  fluids, and/or secretions or excretions, such as sweat, saliva, sputum, nasal mucus, vomit, urine, or feces, on them.  Wear gloves when handling laundry from the patient.  Read and follow directions on labels of laundry or clothing items and detergent. In general, wash and dry with the warmest temperatures recommended on the label.  Clean all areas the individual has used often  Clean all touchable surfaces, such as counters, tabletops, doorknobs, bathroom fixtures, toilets, phones, keyboards, tablets, and bedside tables, every day. Also, clean any surfaces that may have blood, body fluids, and/or secretions or excretions on them.  Wear gloves when cleaning surfaces the patient has come in contact with.  Use a diluted bleach solution (e.g., dilute bleach with 1 part bleach and 10 parts water) or a household disinfectant with a label that says EPA-registered for coronaviruses. To make a bleach solution at home, add 1 tablespoon of bleach to 1 quart (4 cups) of water. For a larger supply, add  cup of bleach to 1 gallon (16 cups) of water.  Read labels of cleaning products and follow recommendations provided on product labels. Labels contain instructions for safe and effective use of the cleaning product including precautions you should take when applying the product, such as wearing gloves or eye protection and making sure you have good ventilation during use of the product.  Remove gloves and wash hands immediately after cleaning.  Monitor yourself for signs and symptoms of illness Caregivers and household members are considered close contacts, should monitor their health, and will be asked to limit movement outside of the home to the extent possible. Follow the monitoring steps for close contacts listed on the symptom monitoring form.   ? If you have additional questions, contact your local health department or call the epidemiologist on call at [phone] (available 24/7). ? This  guidance is subject to change. For the most up-to-date guidance from [facility], please refer to their website: [email or web address]

## 2019-03-19 DIAGNOSIS — E538 Deficiency of other specified B group vitamins: Secondary | ICD-10-CM | POA: Diagnosis not present

## 2019-03-19 DIAGNOSIS — E118 Type 2 diabetes mellitus with unspecified complications: Secondary | ICD-10-CM | POA: Diagnosis not present

## 2019-03-19 DIAGNOSIS — I10 Essential (primary) hypertension: Secondary | ICD-10-CM | POA: Diagnosis not present

## 2019-03-19 DIAGNOSIS — K219 Gastro-esophageal reflux disease without esophagitis: Secondary | ICD-10-CM | POA: Diagnosis not present

## 2019-03-19 DIAGNOSIS — R05 Cough: Secondary | ICD-10-CM | POA: Diagnosis not present

## 2019-03-19 DIAGNOSIS — E559 Vitamin D deficiency, unspecified: Secondary | ICD-10-CM | POA: Diagnosis not present

## 2019-03-19 DIAGNOSIS — Z79899 Other long term (current) drug therapy: Secondary | ICD-10-CM | POA: Diagnosis not present

## 2019-03-19 DIAGNOSIS — K589 Irritable bowel syndrome without diarrhea: Secondary | ICD-10-CM | POA: Diagnosis not present

## 2019-03-19 DIAGNOSIS — Z6822 Body mass index (BMI) 22.0-22.9, adult: Secondary | ICD-10-CM | POA: Diagnosis not present

## 2019-03-19 DIAGNOSIS — E7800 Pure hypercholesterolemia, unspecified: Secondary | ICD-10-CM | POA: Diagnosis not present

## 2019-03-21 DIAGNOSIS — U071 COVID-19: Secondary | ICD-10-CM | POA: Diagnosis not present

## 2019-04-02 DIAGNOSIS — K219 Gastro-esophageal reflux disease without esophagitis: Secondary | ICD-10-CM | POA: Diagnosis not present

## 2019-04-02 DIAGNOSIS — R05 Cough: Secondary | ICD-10-CM | POA: Diagnosis not present

## 2019-04-02 DIAGNOSIS — U071 COVID-19: Secondary | ICD-10-CM | POA: Diagnosis not present

## 2019-04-02 DIAGNOSIS — J189 Pneumonia, unspecified organism: Secondary | ICD-10-CM | POA: Diagnosis not present

## 2019-04-02 DIAGNOSIS — J309 Allergic rhinitis, unspecified: Secondary | ICD-10-CM | POA: Diagnosis not present

## 2019-04-02 DIAGNOSIS — J069 Acute upper respiratory infection, unspecified: Secondary | ICD-10-CM | POA: Diagnosis not present

## 2019-04-02 DIAGNOSIS — I10 Essential (primary) hypertension: Secondary | ICD-10-CM | POA: Diagnosis not present

## 2019-04-02 DIAGNOSIS — K589 Irritable bowel syndrome without diarrhea: Secondary | ICD-10-CM | POA: Diagnosis not present

## 2019-04-02 DIAGNOSIS — E559 Vitamin D deficiency, unspecified: Secondary | ICD-10-CM | POA: Diagnosis not present

## 2019-04-02 DIAGNOSIS — E7800 Pure hypercholesterolemia, unspecified: Secondary | ICD-10-CM | POA: Diagnosis not present

## 2019-04-02 DIAGNOSIS — Z6823 Body mass index (BMI) 23.0-23.9, adult: Secondary | ICD-10-CM | POA: Diagnosis not present

## 2019-04-02 DIAGNOSIS — E538 Deficiency of other specified B group vitamins: Secondary | ICD-10-CM | POA: Diagnosis not present

## 2019-05-09 DIAGNOSIS — J1282 Pneumonia due to coronavirus disease 2019: Secondary | ICD-10-CM | POA: Diagnosis not present

## 2019-05-09 DIAGNOSIS — R05 Cough: Secondary | ICD-10-CM | POA: Diagnosis not present

## 2019-05-09 DIAGNOSIS — U071 COVID-19: Secondary | ICD-10-CM | POA: Diagnosis not present

## 2019-05-09 DIAGNOSIS — I700 Atherosclerosis of aorta: Secondary | ICD-10-CM | POA: Diagnosis not present

## 2019-06-18 DIAGNOSIS — K219 Gastro-esophageal reflux disease without esophagitis: Secondary | ICD-10-CM | POA: Diagnosis not present

## 2019-06-18 DIAGNOSIS — K589 Irritable bowel syndrome without diarrhea: Secondary | ICD-10-CM | POA: Diagnosis not present

## 2019-06-18 DIAGNOSIS — Z125 Encounter for screening for malignant neoplasm of prostate: Secondary | ICD-10-CM | POA: Diagnosis not present

## 2019-06-18 DIAGNOSIS — E538 Deficiency of other specified B group vitamins: Secondary | ICD-10-CM | POA: Diagnosis not present

## 2019-06-18 DIAGNOSIS — U071 COVID-19: Secondary | ICD-10-CM | POA: Diagnosis not present

## 2019-06-18 DIAGNOSIS — E559 Vitamin D deficiency, unspecified: Secondary | ICD-10-CM | POA: Diagnosis not present

## 2019-06-18 DIAGNOSIS — I10 Essential (primary) hypertension: Secondary | ICD-10-CM | POA: Diagnosis not present

## 2019-06-18 DIAGNOSIS — E7800 Pure hypercholesterolemia, unspecified: Secondary | ICD-10-CM | POA: Diagnosis not present

## 2019-06-18 DIAGNOSIS — E118 Type 2 diabetes mellitus with unspecified complications: Secondary | ICD-10-CM | POA: Diagnosis not present

## 2019-06-18 DIAGNOSIS — Z6823 Body mass index (BMI) 23.0-23.9, adult: Secondary | ICD-10-CM | POA: Diagnosis not present

## 2019-06-18 DIAGNOSIS — J309 Allergic rhinitis, unspecified: Secondary | ICD-10-CM | POA: Diagnosis not present

## 2019-08-19 DIAGNOSIS — U071 COVID-19: Secondary | ICD-10-CM | POA: Diagnosis not present

## 2019-08-19 DIAGNOSIS — J1282 Pneumonia due to coronavirus disease 2019: Secondary | ICD-10-CM | POA: Diagnosis not present

## 2019-08-19 DIAGNOSIS — R079 Chest pain, unspecified: Secondary | ICD-10-CM | POA: Diagnosis not present

## 2019-09-18 DIAGNOSIS — I10 Essential (primary) hypertension: Secondary | ICD-10-CM | POA: Diagnosis not present

## 2019-09-18 DIAGNOSIS — K219 Gastro-esophageal reflux disease without esophagitis: Secondary | ICD-10-CM | POA: Diagnosis not present

## 2019-09-18 DIAGNOSIS — Z79899 Other long term (current) drug therapy: Secondary | ICD-10-CM | POA: Diagnosis not present

## 2019-09-18 DIAGNOSIS — E538 Deficiency of other specified B group vitamins: Secondary | ICD-10-CM | POA: Diagnosis not present

## 2019-09-18 DIAGNOSIS — E559 Vitamin D deficiency, unspecified: Secondary | ICD-10-CM | POA: Diagnosis not present

## 2019-09-18 DIAGNOSIS — Z6824 Body mass index (BMI) 24.0-24.9, adult: Secondary | ICD-10-CM | POA: Diagnosis not present

## 2019-09-18 DIAGNOSIS — K589 Irritable bowel syndrome without diarrhea: Secondary | ICD-10-CM | POA: Diagnosis not present

## 2019-09-18 DIAGNOSIS — E118 Type 2 diabetes mellitus with unspecified complications: Secondary | ICD-10-CM | POA: Diagnosis not present

## 2019-09-18 DIAGNOSIS — J309 Allergic rhinitis, unspecified: Secondary | ICD-10-CM | POA: Diagnosis not present

## 2019-09-18 DIAGNOSIS — E7800 Pure hypercholesterolemia, unspecified: Secondary | ICD-10-CM | POA: Diagnosis not present

## 2019-12-22 DIAGNOSIS — E7800 Pure hypercholesterolemia, unspecified: Secondary | ICD-10-CM | POA: Diagnosis not present

## 2019-12-22 DIAGNOSIS — Z79899 Other long term (current) drug therapy: Secondary | ICD-10-CM | POA: Diagnosis not present

## 2019-12-22 DIAGNOSIS — K219 Gastro-esophageal reflux disease without esophagitis: Secondary | ICD-10-CM | POA: Diagnosis not present

## 2019-12-22 DIAGNOSIS — J309 Allergic rhinitis, unspecified: Secondary | ICD-10-CM | POA: Diagnosis not present

## 2019-12-22 DIAGNOSIS — I10 Essential (primary) hypertension: Secondary | ICD-10-CM | POA: Diagnosis not present

## 2019-12-22 DIAGNOSIS — E538 Deficiency of other specified B group vitamins: Secondary | ICD-10-CM | POA: Diagnosis not present

## 2019-12-22 DIAGNOSIS — Z23 Encounter for immunization: Secondary | ICD-10-CM | POA: Diagnosis not present

## 2019-12-22 DIAGNOSIS — E118 Type 2 diabetes mellitus with unspecified complications: Secondary | ICD-10-CM | POA: Diagnosis not present

## 2019-12-22 DIAGNOSIS — Z6824 Body mass index (BMI) 24.0-24.9, adult: Secondary | ICD-10-CM | POA: Diagnosis not present

## 2019-12-22 DIAGNOSIS — K589 Irritable bowel syndrome without diarrhea: Secondary | ICD-10-CM | POA: Diagnosis not present

## 2019-12-22 DIAGNOSIS — E559 Vitamin D deficiency, unspecified: Secondary | ICD-10-CM | POA: Diagnosis not present

## 2020-03-24 DIAGNOSIS — K219 Gastro-esophageal reflux disease without esophagitis: Secondary | ICD-10-CM | POA: Diagnosis not present

## 2020-03-24 DIAGNOSIS — E7800 Pure hypercholesterolemia, unspecified: Secondary | ICD-10-CM | POA: Diagnosis not present

## 2020-03-24 DIAGNOSIS — K589 Irritable bowel syndrome without diarrhea: Secondary | ICD-10-CM | POA: Diagnosis not present

## 2020-03-24 DIAGNOSIS — E538 Deficiency of other specified B group vitamins: Secondary | ICD-10-CM | POA: Diagnosis not present

## 2020-03-24 DIAGNOSIS — E559 Vitamin D deficiency, unspecified: Secondary | ICD-10-CM | POA: Diagnosis not present

## 2020-03-24 DIAGNOSIS — I10 Essential (primary) hypertension: Secondary | ICD-10-CM | POA: Diagnosis not present

## 2020-03-24 DIAGNOSIS — Z6825 Body mass index (BMI) 25.0-25.9, adult: Secondary | ICD-10-CM | POA: Diagnosis not present

## 2020-03-24 DIAGNOSIS — E118 Type 2 diabetes mellitus with unspecified complications: Secondary | ICD-10-CM | POA: Diagnosis not present

## 2020-03-24 DIAGNOSIS — J309 Allergic rhinitis, unspecified: Secondary | ICD-10-CM | POA: Diagnosis not present

## 2020-03-24 DIAGNOSIS — I714 Abdominal aortic aneurysm, without rupture: Secondary | ICD-10-CM | POA: Diagnosis not present

## 2020-03-26 DIAGNOSIS — I714 Abdominal aortic aneurysm, without rupture: Secondary | ICD-10-CM | POA: Diagnosis not present

## 2020-05-04 DIAGNOSIS — J029 Acute pharyngitis, unspecified: Secondary | ICD-10-CM | POA: Diagnosis not present

## 2020-05-04 DIAGNOSIS — B349 Viral infection, unspecified: Secondary | ICD-10-CM | POA: Diagnosis not present

## 2020-05-04 DIAGNOSIS — J020 Streptococcal pharyngitis: Secondary | ICD-10-CM | POA: Diagnosis not present

## 2020-05-18 IMAGING — DX PORTABLE CHEST - 1 VIEW
1 series · 1 of 1 positions shown · non-contrast
Comparison: Chest CT of 06/26/2018

CLINICAL DATA: Right chest pain. Burping.

EXAM:
PORTABLE CHEST 1 VIEW

[chest ap]
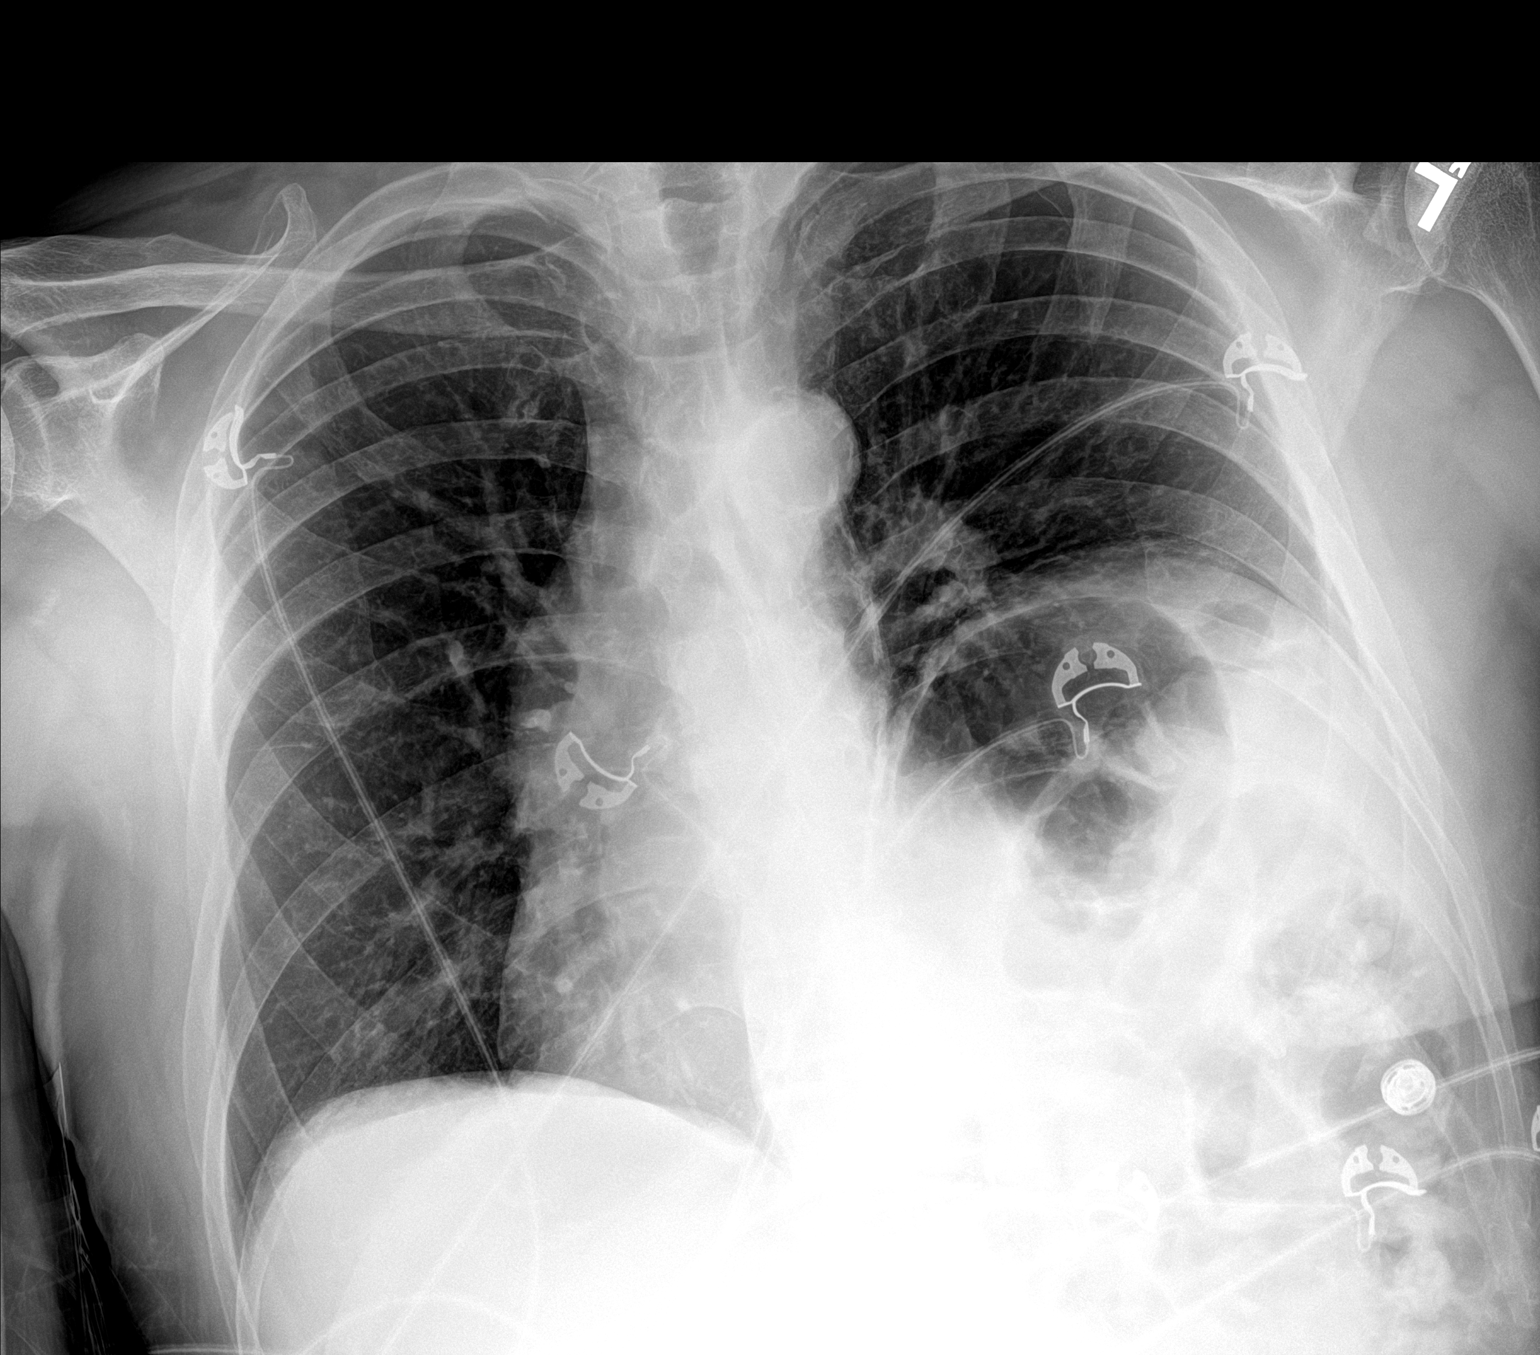

[1 of 1 positions shown; findings below may reference images not displayed]

FINDINGS: Moderate to marked left hemidiaphragm elevation. Patient rotated
minimally right. Normal heart size. Atherosclerosis in the
transverse aorta. No right and no definite left pleural effusion.
Volume loss at the left lung base.
IMPRESSION: 1. No acute cardiopulmonary disease.
2. Moderate to marked left hemidiaphragm elevation with adjacent
left lung base volume loss.
3.  Aortic Atherosclerosis ([id number]).

## 2020-05-18 IMAGING — CT CT ABDOMEN AND PELVIS WITH CONTRAST
3 of 12 series · 12 of 46 positions shown, 16 images · IV contrast (APPLIED)
Comparison: CT a chest 07/04/2018. Abdomen and pelvis CT
05/12/2016.

CLINICAL DATA: Right-sided chest pain for several weeks. Right
upper quadrant pain. Cholecystitis

EXAM:
CT ANGIOGRAPHY CHEST
CT ABDOMEN AND PELVIS WITH CONTRAST
TECHNIQUE: Multidetector CT imaging of the chest was performed using the
standard protocol during bolus administration of intravenous
contrast. Multiplanar CT image reconstructions and MIPs were
obtained to evaluate the vascular anatomy. Multidetector CT imaging
of the abdomen and pelvis was performed using the standard protocol
during bolus administration of intravenous contrast.
CONTRAST:  100mL OMNIPAQUE IOHEXOL 350 MG/ML SOLN

[Series 7: thins · axial · 0.71mm/px · z∈[+1096,+1318]mm · 8 of 374 slices shown]
[im 29/374  soft-tissue]
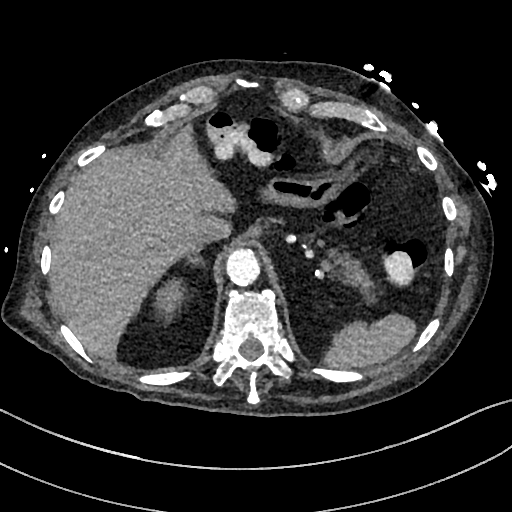
[im 87/374  soft-tissue]
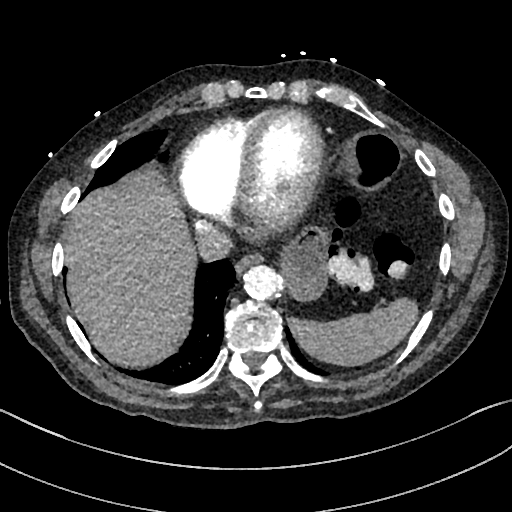
[im 115/374  soft-tissue]
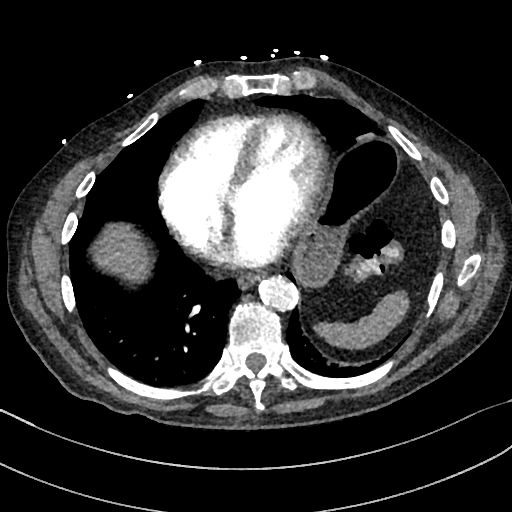
[im 173/374  soft-tissue]
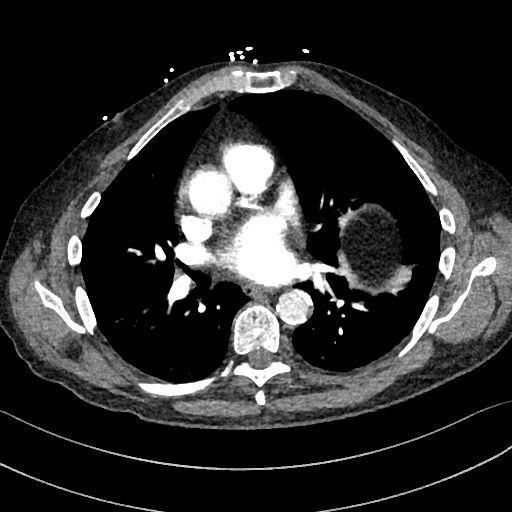
[im 201/374  soft-tissue]
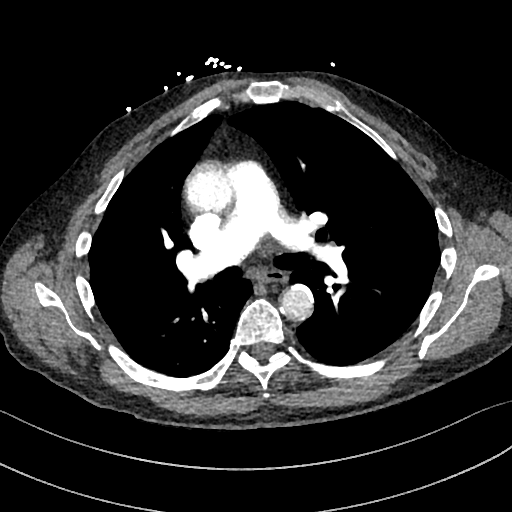
[im 259/374  soft-tissue]
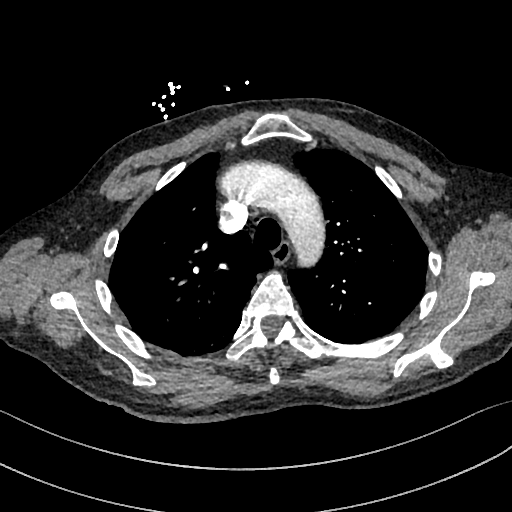
[im 287/374  soft-tissue]
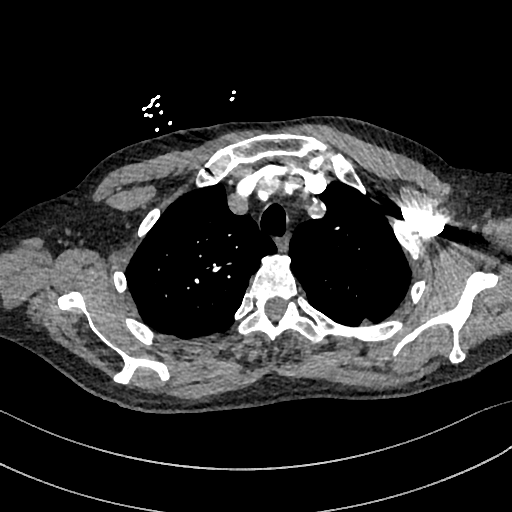
[im 345/374  soft-tissue]
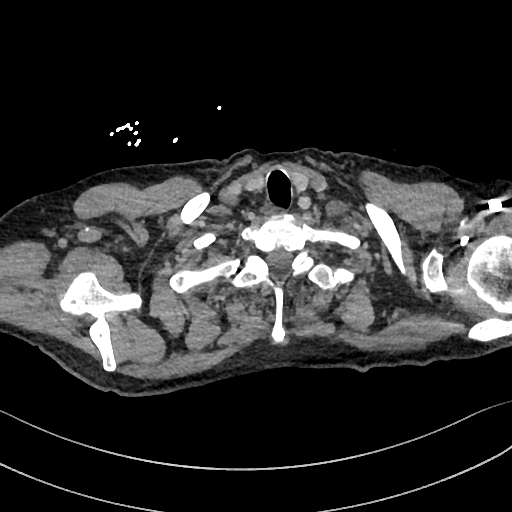

[Series 8: cor · coronal · 0.59mm/px · 2 of 150 slices shown, 3 images]
[im 50/150  soft-tissue]
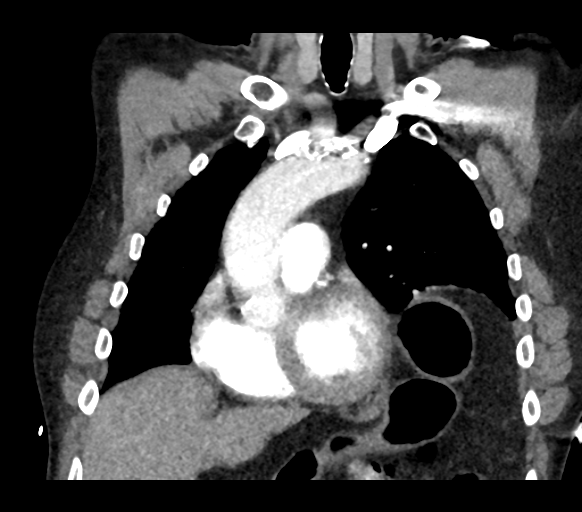
[im 50/150  bone]
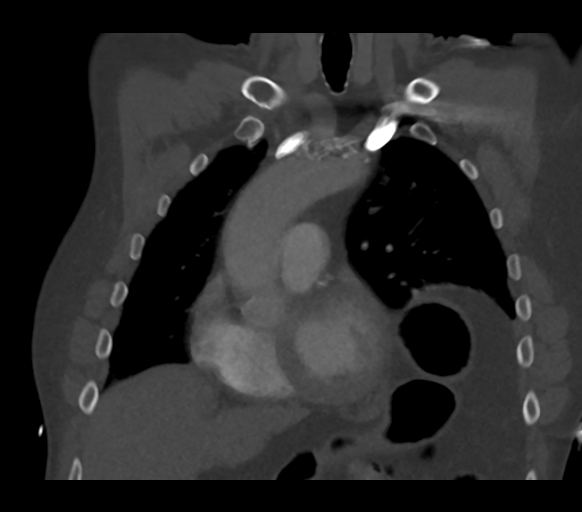
[im 100/150  soft-tissue]
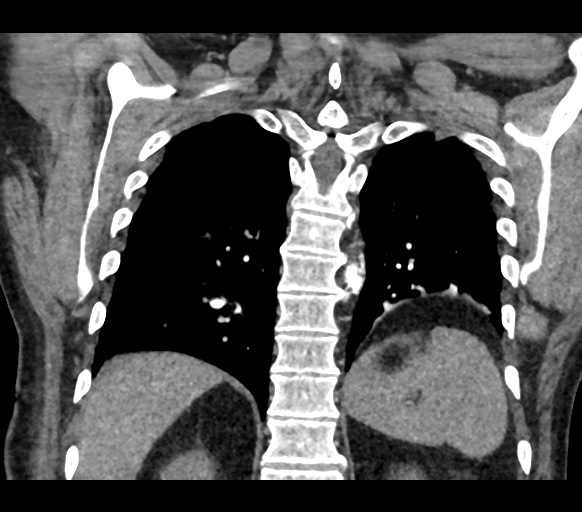

[Series 12: abdomen 5.0 · axial · 0.83mm/px · z∈[+862,+1032]mm · 2 of 104 slices shown, 5 images]
[im 35/104  soft-tissue]
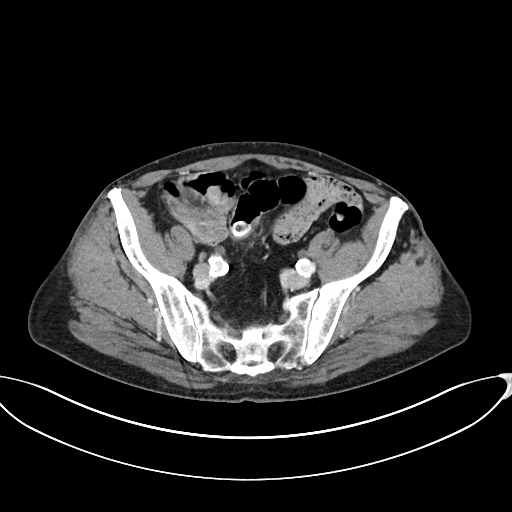
[im 35/104  lung]
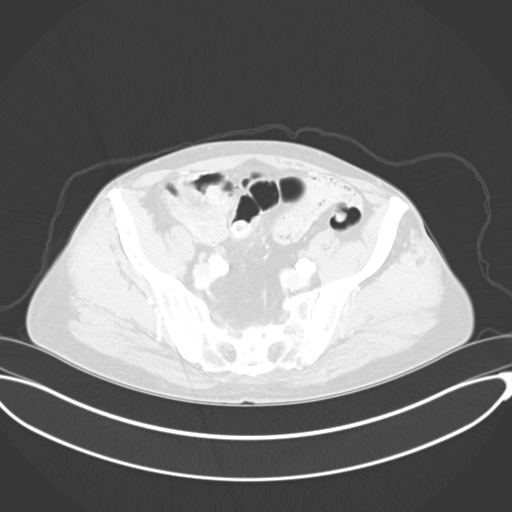
[im 35/104  bone]
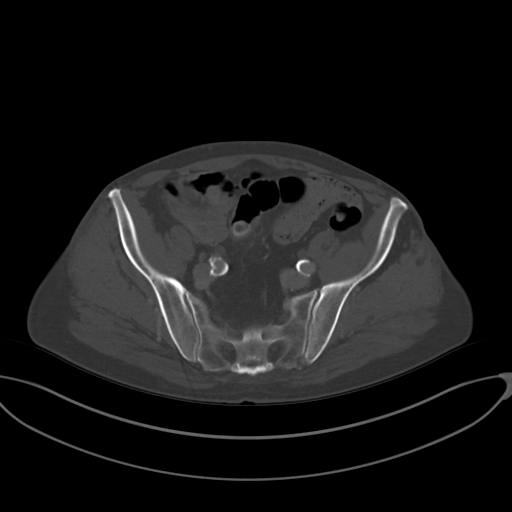
[im 69/104  soft-tissue]
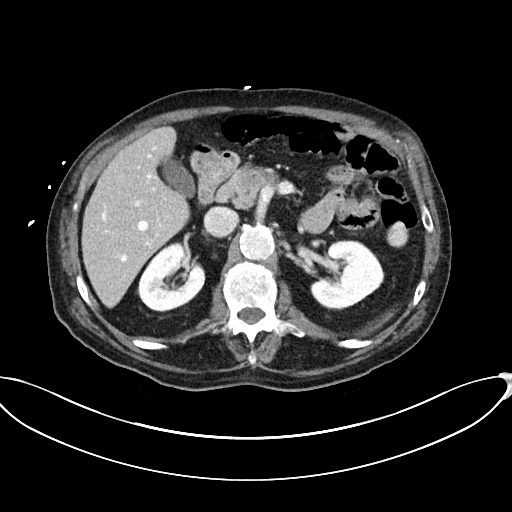
[im 69/104  lung]
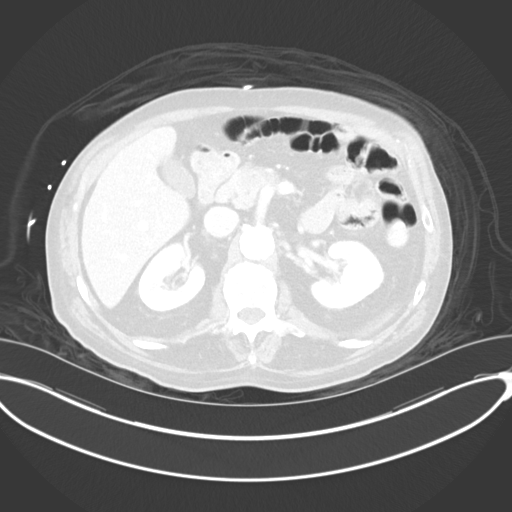

[12 of 46 positions shown; findings below may reference images not displayed]

FINDINGS: CTA CHEST FINDINGS

Cardiovascular: The heart size is normal. No substantial pericardial
effusion. Coronary artery calcification is evident. Atherosclerotic
calcification is noted in the wall of the thoracic aorta. No filling
defect within the opacified pulmonary arteries to suggest the
presence of an acute pulmonary embolus.

Mediastinum/Nodes: No mediastinal lymphadenopathy. There is no hilar
lymphadenopathy. The esophagus has normal imaging features. There is
no axillary lymphadenopathy.

Lungs/Pleura: The central tracheobronchial airways are patent.
Compressive atelectasis noted dependent lower lungs bilaterally.
Calcified granuloma evident in the posterior right upper lobe. Left
lower lobe atelectasis noted.

Musculoskeletal: Bone windows reveal no worrisome lytic or sclerotic
osseous lesions.

Review of the MIP images confirms the above findings.

CT ABDOMEN and PELVIS FINDINGS

Hepatobiliary: No suspicious focal abnormality within the liver
parenchyma. There is no evidence for gallstones, gallbladder wall
thickening, or pericholecystic fluid. No intrahepatic or
extrahepatic biliary dilation.

Pancreas: No focal mass lesion. No dilatation of the main duct. No
intraparenchymal cyst. No peripancreatic edema.

Spleen: No splenomegaly. No focal mass lesion.

Adrenals/Urinary Tract: No adrenal nodule or mass. Kidneys
unremarkable. No evidence for hydroureter. The urinary bladder
appears normal for the degree of distention.

Stomach/Bowel: Stomach is unremarkable. No gastric wall thickening.
No evidence of outlet obstruction. Duodenum is normally positioned
as is the ligament of Treitz. No small bowel wall thickening. No
small bowel dilatation. The terminal ileum is normal. The appendix
is not visualized, but there is no edema or inflammation in the
region of the cecum. Left colonic diverticulosis noted with
edema/inflammation around a thick walled diverticulum (75/12).
Imaging features are compatible with sigmoid diverticulitis. No
perforation or abscess

Vascular/Lymphatic: There is abdominal aortic atherosclerosis.
Infrarenal abdominal aorta measures up to 3.7 cm diameter. There is
no gastrohepatic or hepatoduodenal ligament lymphadenopathy. No
intraperitoneal or retroperitoneal lymphadenopathy. No pelvic
sidewall lymphadenopathy.

Reproductive: The prostate gland and seminal vesicles are
unremarkable.

Other: No intraperitoneal free fluid.

Musculoskeletal: Asymmetric elevation of the left hemidiaphragm is
stable. Small sclerotic focus left ischial tuberosity is stable and
likely a bone island. Bone windows reveal no worrisome lytic or
sclerotic osseous lesions.

Review of the MIP images confirms the above findings.
IMPRESSION: 1. No CT evidence for acute pulmonary embolus. No acute findings in
the chest to explain the patient's history of pain.
2. Sigmoid diverticulitis without perforation or abscess.
3. Infrarenal abdominal aortic aneurysm measuring 3.7 cm maximum
diameter. Recommend followup by ultrasound in 2 years. This
recommendation follows ACR consensus guidelines: White Paper of the
ACR Incidental Findings Committee II on Vascular Findings. [HOSPITAL] 5446; [DATE]. Aortic aneurysm NOS (GKQ3X-ASZ.W)
4.  Aortic Atherosclerois (GKQ3X-170.0)

## 2020-06-30 DIAGNOSIS — I10 Essential (primary) hypertension: Secondary | ICD-10-CM | POA: Diagnosis not present

## 2020-06-30 DIAGNOSIS — K589 Irritable bowel syndrome without diarrhea: Secondary | ICD-10-CM | POA: Diagnosis not present

## 2020-06-30 DIAGNOSIS — Z6824 Body mass index (BMI) 24.0-24.9, adult: Secondary | ICD-10-CM | POA: Diagnosis not present

## 2020-06-30 DIAGNOSIS — K219 Gastro-esophageal reflux disease without esophagitis: Secondary | ICD-10-CM | POA: Diagnosis not present

## 2020-06-30 DIAGNOSIS — Z1331 Encounter for screening for depression: Secondary | ICD-10-CM | POA: Diagnosis not present

## 2020-06-30 DIAGNOSIS — E7800 Pure hypercholesterolemia, unspecified: Secondary | ICD-10-CM | POA: Diagnosis not present

## 2020-06-30 DIAGNOSIS — E538 Deficiency of other specified B group vitamins: Secondary | ICD-10-CM | POA: Diagnosis not present

## 2020-06-30 DIAGNOSIS — Z139 Encounter for screening, unspecified: Secondary | ICD-10-CM | POA: Diagnosis not present

## 2020-06-30 DIAGNOSIS — Z9181 History of falling: Secondary | ICD-10-CM | POA: Diagnosis not present

## 2020-06-30 DIAGNOSIS — E118 Type 2 diabetes mellitus with unspecified complications: Secondary | ICD-10-CM | POA: Diagnosis not present

## 2020-06-30 DIAGNOSIS — Z125 Encounter for screening for malignant neoplasm of prostate: Secondary | ICD-10-CM | POA: Diagnosis not present

## 2020-06-30 DIAGNOSIS — E559 Vitamin D deficiency, unspecified: Secondary | ICD-10-CM | POA: Diagnosis not present

## 2020-09-29 DIAGNOSIS — K589 Irritable bowel syndrome without diarrhea: Secondary | ICD-10-CM | POA: Diagnosis not present

## 2020-09-29 DIAGNOSIS — E118 Type 2 diabetes mellitus with unspecified complications: Secondary | ICD-10-CM | POA: Diagnosis not present

## 2020-09-29 DIAGNOSIS — I714 Abdominal aortic aneurysm, without rupture: Secondary | ICD-10-CM | POA: Diagnosis not present

## 2020-09-29 DIAGNOSIS — E559 Vitamin D deficiency, unspecified: Secondary | ICD-10-CM | POA: Diagnosis not present

## 2020-09-29 DIAGNOSIS — I10 Essential (primary) hypertension: Secondary | ICD-10-CM | POA: Diagnosis not present

## 2020-09-29 DIAGNOSIS — J309 Allergic rhinitis, unspecified: Secondary | ICD-10-CM | POA: Diagnosis not present

## 2020-09-29 DIAGNOSIS — E538 Deficiency of other specified B group vitamins: Secondary | ICD-10-CM | POA: Diagnosis not present

## 2020-09-29 DIAGNOSIS — E7800 Pure hypercholesterolemia, unspecified: Secondary | ICD-10-CM | POA: Diagnosis not present

## 2020-09-29 DIAGNOSIS — Z6824 Body mass index (BMI) 24.0-24.9, adult: Secondary | ICD-10-CM | POA: Diagnosis not present

## 2020-09-29 DIAGNOSIS — K219 Gastro-esophageal reflux disease without esophagitis: Secondary | ICD-10-CM | POA: Diagnosis not present

## 2020-10-25 DIAGNOSIS — I700 Atherosclerosis of aorta: Secondary | ICD-10-CM | POA: Diagnosis not present

## 2020-10-25 DIAGNOSIS — I714 Abdominal aortic aneurysm, without rupture: Secondary | ICD-10-CM | POA: Diagnosis not present

## 2020-11-16 DIAGNOSIS — H26493 Other secondary cataract, bilateral: Secondary | ICD-10-CM | POA: Diagnosis not present

## 2020-11-16 DIAGNOSIS — H353132 Nonexudative age-related macular degeneration, bilateral, intermediate dry stage: Secondary | ICD-10-CM | POA: Diagnosis not present

## 2020-11-16 DIAGNOSIS — H04123 Dry eye syndrome of bilateral lacrimal glands: Secondary | ICD-10-CM | POA: Diagnosis not present

## 2020-11-24 ENCOUNTER — Encounter: Payer: Self-pay | Admitting: Vascular Surgery

## 2020-11-24 ENCOUNTER — Other Ambulatory Visit: Payer: Self-pay

## 2020-11-24 ENCOUNTER — Ambulatory Visit: Payer: PPO | Admitting: Vascular Surgery

## 2020-11-24 VITALS — BP 138/72 | HR 61 | Temp 98.2°F | Resp 20 | Ht 68.0 in | Wt 161.0 lb

## 2020-11-24 DIAGNOSIS — I714 Abdominal aortic aneurysm, without rupture, unspecified: Secondary | ICD-10-CM

## 2020-11-24 DIAGNOSIS — R0989 Other specified symptoms and signs involving the circulatory and respiratory systems: Secondary | ICD-10-CM | POA: Diagnosis not present

## 2020-11-24 NOTE — Progress Notes (Signed)
Patient ID: EDKER BERGSTEN, male   DOB: April 09, 1950, 70 y.o.   MRN: [id number]  Reason for Consult: New Patient (Initial Visit)   Referred by [name]., MD  Subjective:     HPI:  CAIDEN KOLDEN is a 70 y.o. male former smoker with known history of abdominal aortic aneurysm.  He also has hyperlipidemia hypertension.  Denies any history of stroke, TIA or amaurosis.  He does not have any issues with walking.  He denies any tissue loss or ulceration.  He has chronic abdominal pain no new back or abdominal pain.  Has no known family history of aneurysm disease.  He does have 1 biologic daughter she was never a smoker.  Past Medical History:  Diagnosis Date   Abdominal aortic aneurysm (AAA) ([place])    Hearing loss 2019   Hyperlipidemia 2014   Hypertension 2014   Family History  Problem Relation Age of Onset   CAD Father    Stroke Father    Lung disease Mother    Lung disease Brother    Past Surgical History:  Procedure Laterality Date   FOOT SURGERY     KNEE SURGERY     LEFT HEART CATH AND CORONARY ANGIOGRAPHY N/A 07/10/2018   Procedure: LEFT HEART CATH AND CORONARY ANGIOGRAPHY;  Surgeon: Sherren Mocha, MD;  Location: [facility];  Service: Cardiovascular;  Laterality: N/A;   NOSE SURGERY      Short Social History:  Social History   Tobacco Use   Smoking status: Former    Years: 20.00    Types: Cigarettes    Quit date: 1990    Years since quitting: 32.7   Smokeless tobacco: Former    Types: Chew  Substance Use Topics   Alcohol use: Not Currently    Allergies  Allergen Reactions   Ciprofloxacin Nausea Only    Severe nausea   Flagyl [Metronidazole] Nausea Only    Severe nausea   Penicillin G Rash    Did it involve swelling of the face/tongue/throat, SOB, or low BP? Unk Did it involve sudden or severe rash/hives, skin peeling, or any reaction on the inside of your mouth or nose? Unk Did you need to seek medical attention at a hospital or doctor's office?  Unk When did it last happen? From childhood     If all above answers are "NO", may proceed with cephalosporin use.     Current Outpatient Medications  Medication Sig Dispense Refill   acetaminophen (TYLENOL) 325 MG tablet Take 650 mg by mouth every 6 (six) hours as needed (for pain).     albuterol (VENTOLIN HFA) 108 (90 Base) MCG/ACT inhaler Inhale 2 puffs into the lungs every 6 (six) hours as needed for wheezing or shortness of breath. 6.7 g 0   alum & mag hydroxide-simeth (MAALOX/MYLANTA) 200-200-20 MG/5ML suspension Take 30 mLs by mouth every 4 (four) hours as needed for indigestion or heartburn.     amLODipine (NORVASC) 10 MG tablet Take 10 mg by mouth at bedtime.     aspirin EC 81 MG tablet Take 81 mg by mouth at bedtime.     cholecalciferol (VITAMIN D3) 25 MCG (1000 UT) tablet Take 1,000 Units by mouth daily.     Cyanocobalamin (VITAMIN B-12) 2500 MCG SUBL Place 2,500 mcg under the tongue every evening.     dexlansoprazole (DEXILANT) 60 MG capsule Take 60 mg by mouth daily.      dicyclomine (BENTYL) 10 MG capsule Take 10 mg by  mouth 2 (two) times daily as needed.      ergocalciferol (VITAMIN D2) 1.25 MG (50000 UT) capsule Take 50,000 Units by mouth every 'Sunday.     guaiFENesin-dextromethorphan (ROBITUSSIN DM) 100-10 MG/5ML syrup Take 10 mLs by mouth every 4 (four) hours as needed for cough.     losartan (COZAAR) 100 MG tablet Take 100 mg by mouth at bedtime.     methylPREDNISolone (MEDROL DOSEPAK) 4 MG TBPK tablet follow package directions 21 tablet 0   ondansetron (ZOFRAN) 4 MG tablet Take 4 mg by mouth every 6 (six) hours as needed for nausea or vomiting.     pravastatin (PRAVACHOL) 40 MG tablet Take 40 mg by mouth at bedtime.      metoprolol tartrate (LOPRESSOR) 25 MG tablet Take 1 tablet (25 mg total) by mouth 2 (two) times daily. 60 tablet 3   No current facility-administered medications for this visit.    Review of Systems  Constitutional:  Constitutional negative. HENT:  HENT negative.  Eyes: Eyes negative.  Respiratory: Respiratory negative.  Cardiovascular: Cardiovascular negative.  GI: Positive for abdominal pain.  Musculoskeletal: Musculoskeletal negative.  Skin: Skin negative.  Neurological: Neurological negative. Hematologic: Hematologic/lymphatic negative.  Psychiatric: Psychiatric negative.       Objective:  Objective   Vitals:   11/24/20 0819  BP: 138/72  Pulse: 61  Resp: 20  Temp: 98.2 F (36.8 C)  SpO2: 95%  Weight: 161 lb (73 kg)  Height: 5\' 8" (1.727 m)   Body mass index is 24.48 kg/m.  Physical Exam HENT:     Head: Normocephalic.     Nose:     Comments: Wearing a mask Eyes:     Pupils: Pupils are equal, round, and reactive to light.  Neck:     Vascular: Carotid bruit present.  Cardiovascular:     Pulses:          Radial pulses are 2+ on the right side and 2+ on the left side.       Femoral pulses are 2+ on the right side and 2+ on the left side.      Popliteal pulses are 3+ on the right side and 3+ on the left side.       Dorsalis pedis pulses are 2+ on the right side and 2+ on the left side.     Comments: Right carotid bruit Abdominal:     General: Abdomen is flat.     Palpations: Abdomen is soft. There is no mass.  Musculoskeletal:        General: Normal range of motion.     Cervical back: Neck supple.  Skin:    General: Skin is warm.     Capillary Refill: Capillary refill takes less than 2 seconds.  Neurological:     General: No focal deficit present.     Mental Status: He is alert.  Psychiatric:        Mood and Affect: Mood normal.        Behavior: Behavior normal.    Data: Aortic US IMPRESSION: 1. Infrarenal distal abdominal aortic aneurysm measuring 3.6 x 4.2 cm, previously measuring 4.1 x 3.4 cm. Recommend follow-up every 12 months and vascular consultation. This recommendation follows ACR consensus guidelines: White Paper of the ACR Incidental Findings Committee II on Vascular Findings. J Am  Coll Radiol 2013; 10:789-794. 2. Stable atherosclerosis.      Assessment/Plan:     70'$  year old male here with 4.2 cm dominant aneurysm which is stable over the last  2 years.  I discussed the expected outcomes including growth 0.25 to 0.5 cm/year with plan for CT scan at size of 5 cm.  Also discussed the patient has a right-sided carotid bruit he is on aspirin and statin we will check carotid duplexes in the near.  He also has sizable popliteal arteries by physical exam although no frank aneurysms we will also check popliteal duplexes in 1 year.  I discussed the signs and symptoms of rupture of aortic aneurysm as well as the options for repair should this ever be needed.  Patient demonstrates good understanding the presence of his wife we will see him back in 1 year.     Waynetta Sandy MD [facility]

## 2021-01-04 DIAGNOSIS — E7800 Pure hypercholesterolemia, unspecified: Secondary | ICD-10-CM | POA: Diagnosis not present

## 2021-01-04 DIAGNOSIS — I10 Essential (primary) hypertension: Secondary | ICD-10-CM | POA: Diagnosis not present

## 2021-01-04 DIAGNOSIS — Z6825 Body mass index (BMI) 25.0-25.9, adult: Secondary | ICD-10-CM | POA: Diagnosis not present

## 2021-01-04 DIAGNOSIS — E538 Deficiency of other specified B group vitamins: Secondary | ICD-10-CM | POA: Diagnosis not present

## 2021-01-04 DIAGNOSIS — E119 Type 2 diabetes mellitus without complications: Secondary | ICD-10-CM | POA: Diagnosis not present

## 2021-04-27 DIAGNOSIS — I714 Abdominal aortic aneurysm, without rupture, unspecified: Secondary | ICD-10-CM | POA: Diagnosis not present

## 2021-04-27 DIAGNOSIS — E119 Type 2 diabetes mellitus without complications: Secondary | ICD-10-CM | POA: Diagnosis not present

## 2021-04-27 DIAGNOSIS — I10 Essential (primary) hypertension: Secondary | ICD-10-CM | POA: Diagnosis not present

## 2021-04-27 DIAGNOSIS — E7800 Pure hypercholesterolemia, unspecified: Secondary | ICD-10-CM | POA: Diagnosis not present

## 2021-04-27 DIAGNOSIS — Z6824 Body mass index (BMI) 24.0-24.9, adult: Secondary | ICD-10-CM | POA: Diagnosis not present

## 2021-05-05 DIAGNOSIS — H353132 Nonexudative age-related macular degeneration, bilateral, intermediate dry stage: Secondary | ICD-10-CM | POA: Diagnosis not present

## 2021-05-05 DIAGNOSIS — H04123 Dry eye syndrome of bilateral lacrimal glands: Secondary | ICD-10-CM | POA: Diagnosis not present

## 2021-08-01 DIAGNOSIS — E119 Type 2 diabetes mellitus without complications: Secondary | ICD-10-CM | POA: Diagnosis not present

## 2021-08-01 DIAGNOSIS — E7800 Pure hypercholesterolemia, unspecified: Secondary | ICD-10-CM | POA: Diagnosis not present

## 2021-08-01 DIAGNOSIS — K589 Irritable bowel syndrome without diarrhea: Secondary | ICD-10-CM | POA: Diagnosis not present

## 2021-08-01 DIAGNOSIS — Z125 Encounter for screening for malignant neoplasm of prostate: Secondary | ICD-10-CM | POA: Diagnosis not present

## 2021-08-01 DIAGNOSIS — E559 Vitamin D deficiency, unspecified: Secondary | ICD-10-CM | POA: Diagnosis not present

## 2021-08-01 DIAGNOSIS — E538 Deficiency of other specified B group vitamins: Secondary | ICD-10-CM | POA: Diagnosis not present

## 2021-08-01 DIAGNOSIS — Z6824 Body mass index (BMI) 24.0-24.9, adult: Secondary | ICD-10-CM | POA: Diagnosis not present

## 2021-08-01 DIAGNOSIS — Z1331 Encounter for screening for depression: Secondary | ICD-10-CM | POA: Diagnosis not present

## 2021-08-01 DIAGNOSIS — I10 Essential (primary) hypertension: Secondary | ICD-10-CM | POA: Diagnosis not present

## 2021-08-01 DIAGNOSIS — Z9181 History of falling: Secondary | ICD-10-CM | POA: Diagnosis not present

## 2021-11-25 DIAGNOSIS — I77811 Abdominal aortic ectasia: Secondary | ICD-10-CM | POA: Diagnosis not present

## 2021-11-25 DIAGNOSIS — M4316 Spondylolisthesis, lumbar region: Secondary | ICD-10-CM | POA: Diagnosis not present

## 2021-11-25 DIAGNOSIS — M47816 Spondylosis without myelopathy or radiculopathy, lumbar region: Secondary | ICD-10-CM | POA: Diagnosis not present

## 2021-11-25 DIAGNOSIS — M545 Low back pain, unspecified: Secondary | ICD-10-CM | POA: Diagnosis not present

## 2021-11-25 DIAGNOSIS — M5135 Other intervertebral disc degeneration, thoracolumbar region: Secondary | ICD-10-CM | POA: Diagnosis not present

## 2021-11-29 DIAGNOSIS — H26493 Other secondary cataract, bilateral: Secondary | ICD-10-CM | POA: Diagnosis not present

## 2021-11-29 DIAGNOSIS — H353132 Nonexudative age-related macular degeneration, bilateral, intermediate dry stage: Secondary | ICD-10-CM | POA: Diagnosis not present

## 2021-12-07 DIAGNOSIS — M625A9 Muscle wasting and atrophy, not elsewhere classified, back, unspecified level: Secondary | ICD-10-CM | POA: Diagnosis not present

## 2021-12-07 DIAGNOSIS — M48061 Spinal stenosis, lumbar region without neurogenic claudication: Secondary | ICD-10-CM | POA: Diagnosis not present

## 2021-12-07 DIAGNOSIS — M5126 Other intervertebral disc displacement, lumbar region: Secondary | ICD-10-CM | POA: Diagnosis not present

## 2021-12-07 DIAGNOSIS — M47816 Spondylosis without myelopathy or radiculopathy, lumbar region: Secondary | ICD-10-CM | POA: Diagnosis not present

## 2021-12-07 DIAGNOSIS — I714 Abdominal aortic aneurysm, without rupture, unspecified: Secondary | ICD-10-CM | POA: Diagnosis not present

## 2021-12-07 DIAGNOSIS — M5127 Other intervertebral disc displacement, lumbosacral region: Secondary | ICD-10-CM | POA: Diagnosis not present

## 2021-12-27 ENCOUNTER — Telehealth: Payer: Self-pay | Admitting: *Deleted

## 2021-12-27 DIAGNOSIS — M160 Bilateral primary osteoarthritis of hip: Secondary | ICD-10-CM | POA: Diagnosis not present

## 2021-12-27 DIAGNOSIS — M545 Low back pain, unspecified: Secondary | ICD-10-CM | POA: Diagnosis not present

## 2021-12-27 NOTE — Patient Outreach (Signed)
  Care Coordination   Initial Visit Note   12/27/2021 Name: AARYA QUEBEDEAUX MRN: [id number] DOB: [birth year]  Brock Bad is a 71 y.o. year old male who sees Potts, Georgeann Oppenheim, NP for primary care. I spoke with  wife of MARVIE CALENDER by phone today.  What matters to the patients health and wellness today?  Declines Care Coordination services at this time- stating he sees PCP every 55month.    Goals Addressed   None     SDOH assessments and interventions completed:  No     Care Coordination Interventions Activated:  No  Care Coordination Interventions:  No, not indicated   Follow up plan: No further intervention required.   Encounter Outcome:  Pt. Refused   JEduard ClosMSW, LCSW Licensed Clinical Social Worker      [phone]

## 2022-01-13 ENCOUNTER — Encounter: Payer: Self-pay | Admitting: Gastroenterology

## 2022-02-15 DIAGNOSIS — I10 Essential (primary) hypertension: Secondary | ICD-10-CM | POA: Diagnosis not present

## 2022-02-15 DIAGNOSIS — E559 Vitamin D deficiency, unspecified: Secondary | ICD-10-CM | POA: Diagnosis not present

## 2022-02-15 DIAGNOSIS — E785 Hyperlipidemia, unspecified: Secondary | ICD-10-CM | POA: Diagnosis not present

## 2022-02-15 DIAGNOSIS — G8929 Other chronic pain: Secondary | ICD-10-CM | POA: Diagnosis not present

## 2022-02-15 DIAGNOSIS — H3530 Unspecified macular degeneration: Secondary | ICD-10-CM | POA: Diagnosis not present

## 2022-02-15 DIAGNOSIS — K219 Gastro-esophageal reflux disease without esophagitis: Secondary | ICD-10-CM | POA: Diagnosis not present

## 2022-02-15 DIAGNOSIS — H9193 Unspecified hearing loss, bilateral: Secondary | ICD-10-CM | POA: Diagnosis not present

## 2022-02-15 DIAGNOSIS — J309 Allergic rhinitis, unspecified: Secondary | ICD-10-CM | POA: Diagnosis not present

## 2022-02-15 DIAGNOSIS — I700 Atherosclerosis of aorta: Secondary | ICD-10-CM | POA: Diagnosis not present

## 2022-02-15 DIAGNOSIS — E538 Deficiency of other specified B group vitamins: Secondary | ICD-10-CM | POA: Diagnosis not present

## 2022-02-15 DIAGNOSIS — I2510 Atherosclerotic heart disease of native coronary artery without angina pectoris: Secondary | ICD-10-CM | POA: Diagnosis not present

## 2022-02-15 DIAGNOSIS — I714 Abdominal aortic aneurysm, without rupture, unspecified: Secondary | ICD-10-CM | POA: Diagnosis not present

## 2022-02-22 DIAGNOSIS — M542 Cervicalgia: Secondary | ICD-10-CM | POA: Diagnosis not present

## 2022-02-22 DIAGNOSIS — E7800 Pure hypercholesterolemia, unspecified: Secondary | ICD-10-CM | POA: Diagnosis not present

## 2022-02-22 DIAGNOSIS — I714 Abdominal aortic aneurysm, without rupture, unspecified: Secondary | ICD-10-CM | POA: Diagnosis not present

## 2022-02-22 DIAGNOSIS — Z139 Encounter for screening, unspecified: Secondary | ICD-10-CM | POA: Diagnosis not present

## 2022-02-22 DIAGNOSIS — K589 Irritable bowel syndrome without diarrhea: Secondary | ICD-10-CM | POA: Diagnosis not present

## 2022-02-22 DIAGNOSIS — E559 Vitamin D deficiency, unspecified: Secondary | ICD-10-CM | POA: Diagnosis not present

## 2022-02-22 DIAGNOSIS — Z6824 Body mass index (BMI) 24.0-24.9, adult: Secondary | ICD-10-CM | POA: Diagnosis not present

## 2022-02-22 DIAGNOSIS — E538 Deficiency of other specified B group vitamins: Secondary | ICD-10-CM | POA: Diagnosis not present

## 2022-02-22 DIAGNOSIS — E119 Type 2 diabetes mellitus without complications: Secondary | ICD-10-CM | POA: Diagnosis not present

## 2022-02-22 DIAGNOSIS — I10 Essential (primary) hypertension: Secondary | ICD-10-CM | POA: Diagnosis not present

## 2022-03-02 DIAGNOSIS — M542 Cervicalgia: Secondary | ICD-10-CM | POA: Diagnosis not present

## 2022-03-14 DIAGNOSIS — I2510 Atherosclerotic heart disease of native coronary artery without angina pectoris: Secondary | ICD-10-CM | POA: Diagnosis not present

## 2022-03-14 DIAGNOSIS — I2583 Coronary atherosclerosis due to lipid rich plaque: Secondary | ICD-10-CM | POA: Diagnosis not present

## 2022-03-14 DIAGNOSIS — I6523 Occlusion and stenosis of bilateral carotid arteries: Secondary | ICD-10-CM | POA: Diagnosis not present

## 2022-03-24 ENCOUNTER — Other Ambulatory Visit: Payer: Self-pay | Admitting: *Deleted

## 2022-03-24 DIAGNOSIS — I714 Abdominal aortic aneurysm, without rupture, unspecified: Secondary | ICD-10-CM

## 2022-03-24 DIAGNOSIS — R0989 Other specified symptoms and signs involving the circulatory and respiratory systems: Secondary | ICD-10-CM

## 2022-04-04 NOTE — Progress Notes (Signed)
HISTORY AND PHYSICAL     CC:  follow up. Requesting Provider:  Earlyne Iba, NP  HPI: This is a 72 y.o. male who is here today for follow up and is pt of Dr. Donzetta Matters and has hx of known history of abdominal aortic aneurysm. He also has hyperlipidemia & hypertension. He does not have any issues with walking. He denies any tissue loss or ulceration. He has chronic abdominal pain no new back or abdominal pain. Has no known family history of aneurysm disease. He does have 1 biologic daughter she was never a smoker.   Pt was last seen on 11/24/2020 by Dr. Donzetta Matters.  At that time, pt was seen for fusiform AAA measuring 4.2cm.  He did not have any claudication, rest pain or non healing wounds.  He was scheduled for carotid and AAA duplex, however, he had some testing at outside facility late last year and Dr. Donzetta Matters was fine with these results and they did not need to be repeated.  He felt he had sizable popliteal arteries by physical exam but no fran aneurysms and was scheduled for duplex at follow up visit.    Pt had lumbar spine MR on 12/07/2021 and that revealed no change in his AAA measuring 4.2cm.  Carotid duplex in December 2023 revealed 1-49% bilateral ICA stenosis.   The pt returns today for follow up with BLE popliteal duplex.   Pt denies any amaurosis fugax, speech difficulties, weakness, numbness, paralysis or clumsiness or facial droop.    Pt denies claudication, rest pain, or non healing wounds.  He states that he has chronic abdominal pain and this remains unchanged.    He states that his company was closed last Friday and he was then retired.    He tells me that his blood pressure runs around 937'J systolic.   He and his wife are from [place] area.    The pt is on a statin for cholesterol management.    The pt is on an aspirin.    Other AC:  none The pt is on CCB, ARB, BB for hypertension.  The pt does not have diabetes. Tobacco hx:  former-quit 1990    Past Medical History:   Diagnosis Date   Abdominal aortic aneurysm (AAA) ([place])    Hearing loss 2019   Hyperlipidemia 2014   Hypertension 2014    Past Surgical History:  Procedure Laterality Date   FOOT SURGERY     KNEE SURGERY     LEFT HEART CATH AND CORONARY ANGIOGRAPHY N/A 07/10/2018   Procedure: LEFT HEART CATH AND CORONARY ANGIOGRAPHY;  Surgeon: Sherren Mocha, MD;  Location: [facility];  Service: Cardiovascular;  Laterality: N/A;   NOSE SURGERY      Allergies  Allergen Reactions   Ciprofloxacin Nausea Only    Severe nausea   Flagyl [Metronidazole] Nausea Only    Severe nausea   Penicillin G Rash    Did it involve swelling of the face/tongue/throat, SOB, or low BP? Unk Did it involve sudden or severe rash/hives, skin peeling, or any reaction on the inside of your mouth or nose? Unk Did you need to seek medical attention at a hospital or doctor's office? Unk When did it last happen? From childhood     If all above answers are "NO", may proceed with cephalosporin use.     Current Outpatient Medications  Medication Sig Dispense Refill   acetaminophen (TYLENOL) 325 MG tablet Take 650 mg by mouth every 6 (six) hours  as needed (for pain).     albuterol (VENTOLIN HFA) 108 (90 Base) MCG/ACT inhaler Inhale 2 puffs into the lungs every 6 (six) hours as needed for wheezing or shortness of breath. 6.7 g 0   alum & mag hydroxide-simeth (MAALOX/MYLANTA) 200-200-20 MG/5ML suspension Take 30 mLs by mouth every 4 (four) hours as needed for indigestion or heartburn.     amLODipine (NORVASC) 10 MG tablet Take 10 mg by mouth at bedtime.     aspirin EC 81 MG tablet Take 81 mg by mouth at bedtime.     cholecalciferol (VITAMIN D3) 25 MCG (1000 UT) tablet Take 1,000 Units by mouth daily.     Cyanocobalamin (VITAMIN B-12) 2500 MCG SUBL Place 2,500 mcg under the tongue every evening.     dexlansoprazole (DEXILANT) 60 MG capsule Take 60 mg by mouth daily.      dicyclomine (BENTYL) 10 MG capsule Take 10 mg by  mouth 2 (two) times daily as needed.      ergocalciferol (VITAMIN D2) 1.25 MG (50000 UT) capsule Take 50,000 Units by mouth every Sunday.     guaiFENesin-dextromethorphan (ROBITUSSIN DM) 100-10 MG/5ML syrup Take 10 mLs by mouth every 4 (four) hours as needed for cough.     losartan (COZAAR) 100 MG tablet Take 100 mg by mouth at bedtime.     methylPREDNISolone (MEDROL DOSEPAK) 4 MG TBPK tablet follow package directions 21 tablet 0   metoprolol tartrate (LOPRESSOR) 25 MG tablet Take 1 tablet (25 mg total) by mouth 2 (two) times daily. 60 tablet 3   ondansetron (ZOFRAN) 4 MG tablet Take 4 mg by mouth every 6 (six) hours as needed for nausea or vomiting.     pravastatin (PRAVACHOL) 40 MG tablet Take 40 mg by mouth at bedtime.      No current facility-administered medications for this visit.    Family History  Problem Relation Age of Onset   CAD Father    Stroke Father    Lung disease Mother    Lung disease Brother     Social History   Socioeconomic History   Marital status: Married    Spouse name: Not on file   Number of children: Not on file   Years of education: Not on file   Highest education level: Not on file  Occupational History   Not on file  Tobacco Use   Smoking status: Former    Years: 20.00    Types: Cigarettes    Quit date: 1990    Years since quitting: 34.0   Smokeless tobacco: Former    Types: Nurse, children's Use: Never used  Substance and Sexual Activity   Alcohol use: Not Currently   Drug use: Not Currently   Sexual activity: Not on file  Other Topics Concern   Not on file  Social History Narrative   Not on file   Social Determinants of Health   Financial Resource Strain: Not on file  Food Insecurity: Not on file  Transportation Needs: Not on file  Physical Activity: Not on file  Stress: Not on file  Social Connections: Not on file  Intimate Partner Violence: Not on file     REVIEW OF SYSTEMS:   '[X]'$  denotes positive finding, '[ ]'$   denotes negative finding Cardiac  Comments:  Chest pain or chest pressure:    Shortness of breath upon exertion:    Short of breath when lying flat:    Irregular heart rhythm:  Vascular    Pain in calf, thigh, or hip brought on by ambulation:    Pain in feet at night that wakes you up from your sleep:     Blood clot in your veins:    Leg swelling:         Pulmonary    Oxygen at home:    Wheezing:         Neurologic    Sudden weakness in arms or legs:     Sudden numbness in arms or legs:     Sudden onset of difficulty speaking or understanding others    Temporary loss of vision in one eye:     Problems with dizziness:         Gastrointestinal    Blood in stool:     Vomited blood:         Genitourinary    Burning when urinating:     Blood in urine:        Psychiatric    Major depression:         Hematologic    Bleeding problems:    Problems with blood clotting too easily:        Skin    Rashes or ulcers:        Constitutional    Fever or chills:      PHYSICAL EXAMINATION:  Today's Vitals   04/07/22 1336 04/07/22 1340  BP: (!) 147/84 (!) 141/84  Pulse: 60   Resp: 20   Temp: 98.2 F (36.8 C)   TempSrc: Temporal   SpO2: 97%   Weight: 157 lb 14.4 oz (71.6 kg)   Height: '5\' 8"'$  (1.727 m)   PainSc: 5    PainLoc: Neck    Body mass index is 24.01 kg/m.   General:  WDWN in NAD; vital signs documented above Gait: Not observed HENT: WNL, normocephalic Pulmonary: normal non-labored breathing  Cardiac: regular HR Abdomen: soft, NT, aortic pulse is not palpable Skin: without rashes Vascular Exam/Pulses:  Right Left  Radial 2+ (normal) 2+ (normal)  Popliteal Unable to palpate Unable to palpate  DP 2+ (normal) 2+ (normal)   Extremities: toe deformities bilaterally Musculoskeletal: no muscle wasting or atrophy  Neurologic: A&O X 3;  speech is fluent/normal; moving all extremities equally  Psychiatric:  The pt has Normal affect.   Non-Invasive  Vascular Imaging:    Arterial duplex on 04/07/2022: +---------------+-------+-----------+--------+--------+-----+--------+  Right PoplitealAP (cm)Transv (cm)WaveformStenosisShapeComments  +---------------+-------+-----------+--------+--------+-----+--------+  Proximal      0.83   0.87       biphasicnormal                 +---------------+-------+-----------+--------+--------+-----+--------+  Mid           0.90   0.93       biphasicnormal                 +---------------+-------+-----------+--------+--------+-----+--------+  Distal        0.77   0.80       biphasicnormal                 +---------------+-------+-----------+--------+--------+-----+--------+   +--------------+-------+-----------+--------+--------+-----+--------+  Left PoplitealAP (cm)Transv (cm)WaveformStenosisShapeComments  +--------------+-------+-----------+--------+--------+-----+--------+  Proximal     0.90   0.83       biphasicnormal                 +--------------+-------+-----------+--------+--------+-----+--------+  Mid          0.96   0.84       biphasicnormal                 +--------------+-------+-----------+--------+--------+-----+--------+  Distal       0.69   0.92       biphasicnormal                 +--------------+-------+-----------+--------+--------+-----+--------+   Summary:  Right: Patent popliteal artery with no obvious evidence of aneurysm.  Left: Patent popliteal artery with no obvious evidence of aneurysm.   Carotid Duplex on 03/14/2022: Right:  1-49% ICA stenosis Left:  1-49% ICA stenosis   ASSESSMENT/PLAN:: 72 y.o. male here for follow up for AAA and carotid artery stenosis   AAA -MRI in September 2023 revealed that the AAA is unchanged at 4.2cm.   -pt does not have rest pain, claudication, non healing wounds. -pt will f/u in September/October with AAA duplex. -BLE duplex today reveals he does not have evidence of popliteal  aneurysm bilaterally. -discussed with he and his wife, should he experience sudden, severe abdominal or back pain, to call 911. -discussed with him that his daughter should be evaluated for AAA at some point given there could be a familial component to this.  Carotid stenosis -duplex December 2023 reveals bilateral 1-49% ICA stenosis (these results found under imaging [see all exams] in MyChart).  He has been asymptomatic. -discussed s/s of stroke with pt and he understands should he develop any of these sx, he will go to the nearest ER or call 911. -given he had 1-49% bilateral ICA stenosis, would not repeat u/s unless he becomes symptomatic.   -continue statin/asa  -if he has any issues before his next visit, he will call sooner.    Leontine Locket, [facility] [phone]  Clinic MD:   Virl Cagey

## 2022-04-07 ENCOUNTER — Ambulatory Visit (HOSPITAL_COMMUNITY)
Admission: RE | Admit: 2022-04-07 | Discharge: 2022-04-07 | Disposition: A | Discharge status: Home / Self Care | Payer: PPO | Source: Ambulatory Visit | Attending: Vascular Surgery | Admitting: Vascular Surgery

## 2022-04-07 ENCOUNTER — Ambulatory Visit (INDEPENDENT_AMBULATORY_CARE_PROVIDER_SITE_OTHER): Payer: PPO | Admitting: Physician Assistant

## 2022-04-07 VITALS — BP 141/84 | HR 60 | Temp 98.2°F | Resp 20 | Ht 68.0 in | Wt 157.9 lb

## 2022-04-07 DIAGNOSIS — I714 Abdominal aortic aneurysm, without rupture, unspecified: Secondary | ICD-10-CM

## 2022-04-12 ENCOUNTER — Other Ambulatory Visit: Payer: Self-pay

## 2022-04-12 DIAGNOSIS — I714 Abdominal aortic aneurysm, without rupture, unspecified: Secondary | ICD-10-CM

## 2022-04-17 DIAGNOSIS — K219 Gastro-esophageal reflux disease without esophagitis: Secondary | ICD-10-CM | POA: Diagnosis not present

## 2022-04-17 DIAGNOSIS — K5790 Diverticulosis of intestine, part unspecified, without perforation or abscess without bleeding: Secondary | ICD-10-CM | POA: Diagnosis not present

## 2022-05-10 DIAGNOSIS — Z1211 Encounter for screening for malignant neoplasm of colon: Secondary | ICD-10-CM | POA: Diagnosis not present

## 2022-05-10 DIAGNOSIS — K5730 Diverticulosis of large intestine without perforation or abscess without bleeding: Secondary | ICD-10-CM | POA: Diagnosis not present

## 2022-05-10 DIAGNOSIS — E119 Type 2 diabetes mellitus without complications: Secondary | ICD-10-CM | POA: Diagnosis not present

## 2022-05-11 DIAGNOSIS — H26493 Other secondary cataract, bilateral: Secondary | ICD-10-CM | POA: Diagnosis not present

## 2022-05-11 DIAGNOSIS — H04123 Dry eye syndrome of bilateral lacrimal glands: Secondary | ICD-10-CM | POA: Diagnosis not present

## 2022-05-11 DIAGNOSIS — H43811 Vitreous degeneration, right eye: Secondary | ICD-10-CM | POA: Diagnosis not present

## 2022-05-11 DIAGNOSIS — H353132 Nonexudative age-related macular degeneration, bilateral, intermediate dry stage: Secondary | ICD-10-CM | POA: Diagnosis not present

## 2022-05-24 DIAGNOSIS — H26493 Other secondary cataract, bilateral: Secondary | ICD-10-CM | POA: Diagnosis not present

## 2022-06-26 DIAGNOSIS — E119 Type 2 diabetes mellitus without complications: Secondary | ICD-10-CM | POA: Diagnosis not present

## 2022-06-26 DIAGNOSIS — E7800 Pure hypercholesterolemia, unspecified: Secondary | ICD-10-CM | POA: Diagnosis not present

## 2022-06-26 DIAGNOSIS — Z6823 Body mass index (BMI) 23.0-23.9, adult: Secondary | ICD-10-CM | POA: Diagnosis not present

## 2022-06-26 DIAGNOSIS — I714 Abdominal aortic aneurysm, without rupture, unspecified: Secondary | ICD-10-CM | POA: Diagnosis not present

## 2022-06-26 DIAGNOSIS — E538 Deficiency of other specified B group vitamins: Secondary | ICD-10-CM | POA: Diagnosis not present

## 2022-06-26 DIAGNOSIS — E559 Vitamin D deficiency, unspecified: Secondary | ICD-10-CM | POA: Diagnosis not present

## 2022-06-26 DIAGNOSIS — I10 Essential (primary) hypertension: Secondary | ICD-10-CM | POA: Diagnosis not present

## 2022-07-27 DIAGNOSIS — F439 Reaction to severe stress, unspecified: Secondary | ICD-10-CM | POA: Diagnosis not present

## 2022-10-30 DIAGNOSIS — F439 Reaction to severe stress, unspecified: Secondary | ICD-10-CM | POA: Diagnosis not present

## 2022-10-30 DIAGNOSIS — Z125 Encounter for screening for malignant neoplasm of prostate: Secondary | ICD-10-CM | POA: Diagnosis not present

## 2022-10-30 DIAGNOSIS — K589 Irritable bowel syndrome without diarrhea: Secondary | ICD-10-CM | POA: Diagnosis not present

## 2022-10-30 DIAGNOSIS — E119 Type 2 diabetes mellitus without complications: Secondary | ICD-10-CM | POA: Diagnosis not present

## 2022-10-30 DIAGNOSIS — I10 Essential (primary) hypertension: Secondary | ICD-10-CM | POA: Diagnosis not present

## 2022-10-30 DIAGNOSIS — E559 Vitamin D deficiency, unspecified: Secondary | ICD-10-CM | POA: Diagnosis not present

## 2022-10-30 DIAGNOSIS — I714 Abdominal aortic aneurysm, without rupture, unspecified: Secondary | ICD-10-CM | POA: Diagnosis not present

## 2022-10-30 DIAGNOSIS — Z1331 Encounter for screening for depression: Secondary | ICD-10-CM | POA: Diagnosis not present

## 2022-10-30 DIAGNOSIS — E7800 Pure hypercholesterolemia, unspecified: Secondary | ICD-10-CM | POA: Diagnosis not present

## 2022-10-30 DIAGNOSIS — E538 Deficiency of other specified B group vitamins: Secondary | ICD-10-CM | POA: Diagnosis not present

## 2022-11-02 DIAGNOSIS — M48061 Spinal stenosis, lumbar region without neurogenic claudication: Secondary | ICD-10-CM | POA: Diagnosis not present

## 2022-11-02 DIAGNOSIS — M439 Deforming dorsopathy, unspecified: Secondary | ICD-10-CM | POA: Diagnosis not present

## 2022-11-02 DIAGNOSIS — M47812 Spondylosis without myelopathy or radiculopathy, cervical region: Secondary | ICD-10-CM | POA: Diagnosis not present

## 2022-11-02 DIAGNOSIS — M4803 Spinal stenosis, cervicothoracic region: Secondary | ICD-10-CM | POA: Diagnosis not present

## 2022-11-02 DIAGNOSIS — M4802 Spinal stenosis, cervical region: Secondary | ICD-10-CM | POA: Diagnosis not present

## 2022-11-02 DIAGNOSIS — S12500A Unspecified displaced fracture of sixth cervical vertebra, initial encounter for closed fracture: Secondary | ICD-10-CM | POA: Diagnosis not present

## 2022-11-10 DIAGNOSIS — M47812 Spondylosis without myelopathy or radiculopathy, cervical region: Secondary | ICD-10-CM | POA: Diagnosis not present

## 2022-11-10 DIAGNOSIS — M542 Cervicalgia: Secondary | ICD-10-CM | POA: Diagnosis not present

## 2022-11-23 DIAGNOSIS — R293 Abnormal posture: Secondary | ICD-10-CM | POA: Diagnosis not present

## 2022-11-23 DIAGNOSIS — M542 Cervicalgia: Secondary | ICD-10-CM | POA: Diagnosis not present

## 2022-11-27 DIAGNOSIS — M542 Cervicalgia: Secondary | ICD-10-CM | POA: Diagnosis not present

## 2022-11-27 DIAGNOSIS — R293 Abnormal posture: Secondary | ICD-10-CM | POA: Diagnosis not present

## 2022-11-30 DIAGNOSIS — R293 Abnormal posture: Secondary | ICD-10-CM | POA: Diagnosis not present

## 2022-11-30 DIAGNOSIS — M542 Cervicalgia: Secondary | ICD-10-CM | POA: Diagnosis not present

## 2022-12-05 DIAGNOSIS — M542 Cervicalgia: Secondary | ICD-10-CM | POA: Diagnosis not present

## 2022-12-05 DIAGNOSIS — R293 Abnormal posture: Secondary | ICD-10-CM | POA: Diagnosis not present

## 2022-12-07 DIAGNOSIS — M542 Cervicalgia: Secondary | ICD-10-CM | POA: Diagnosis not present

## 2022-12-07 DIAGNOSIS — R293 Abnormal posture: Secondary | ICD-10-CM | POA: Diagnosis not present

## 2022-12-11 DIAGNOSIS — M542 Cervicalgia: Secondary | ICD-10-CM | POA: Diagnosis not present

## 2022-12-11 DIAGNOSIS — R293 Abnormal posture: Secondary | ICD-10-CM | POA: Diagnosis not present

## 2022-12-13 DIAGNOSIS — M542 Cervicalgia: Secondary | ICD-10-CM | POA: Diagnosis not present

## 2022-12-13 DIAGNOSIS — R293 Abnormal posture: Secondary | ICD-10-CM | POA: Diagnosis not present

## 2022-12-19 DIAGNOSIS — R293 Abnormal posture: Secondary | ICD-10-CM | POA: Diagnosis not present

## 2022-12-19 DIAGNOSIS — M542 Cervicalgia: Secondary | ICD-10-CM | POA: Diagnosis not present

## 2022-12-21 DIAGNOSIS — R293 Abnormal posture: Secondary | ICD-10-CM | POA: Diagnosis not present

## 2022-12-21 DIAGNOSIS — M542 Cervicalgia: Secondary | ICD-10-CM | POA: Diagnosis not present

## 2022-12-27 DIAGNOSIS — Z0000 Encounter for general adult medical examination without abnormal findings: Secondary | ICD-10-CM | POA: Diagnosis not present

## 2022-12-27 DIAGNOSIS — Z9181 History of falling: Secondary | ICD-10-CM | POA: Diagnosis not present

## 2022-12-27 DIAGNOSIS — Z139 Encounter for screening, unspecified: Secondary | ICD-10-CM | POA: Diagnosis not present

## 2023-01-10 ENCOUNTER — Ambulatory Visit (INDEPENDENT_AMBULATORY_CARE_PROVIDER_SITE_OTHER): Payer: HMO | Admitting: Physician Assistant

## 2023-01-10 ENCOUNTER — Ambulatory Visit (HOSPITAL_COMMUNITY)
Admission: RE | Admit: 2023-01-10 | Discharge: 2023-01-10 | Disposition: A | Discharge status: Home / Self Care | Payer: HMO | Source: Ambulatory Visit | Attending: Vascular Surgery | Admitting: Vascular Surgery

## 2023-01-10 VITALS — BP 144/71 | HR 46 | Temp 97.4°F | Ht 68.0 in | Wt 153.1 lb

## 2023-01-10 DIAGNOSIS — I714 Abdominal aortic aneurysm, without rupture, unspecified: Secondary | ICD-10-CM

## 2023-01-10 NOTE — Progress Notes (Signed)
Office Note   History of Present Illness   Jeffery Payne is a 72 y.o. (1950-06-30) male who presents for surveillance of AAA.  He has a known history of AAA, with last maximum measurement of 4.2 cm.  At his last office visit, bilateral lower extremity arterial duplex demonstrated no popliteal artery aneurysms.  He returns today for follow-up.  He denies any issues with new abdominal, back, or chest pain.  He does have chronic abdominal pain which is unchanged.  He denies any rest pain, claudication, or tissue loss.   He takes a daily aspirin and statin.  Dr. Randie Heinz has previously discussed with the patient he would require repeat CT scan if his AAA became 5 cm or if it grew more than 0.25-0.5cm in 1 year. Current Outpatient Medications  Medication Sig Dispense Refill   acetaminophen (TYLENOL) 325 MG tablet Take 650 mg by mouth every 6 (six) hours as needed (for pain).     albuterol (VENTOLIN HFA) 108 (90 Base) MCG/ACT inhaler Inhale 2 puffs into the lungs every 6 (six) hours as needed for wheezing or shortness of breath. 6.7 g 0   alum & mag hydroxide-simeth (MAALOX/MYLANTA) 200-200-20 MG/5ML suspension Take 30 mLs by mouth every 4 (four) hours as needed for indigestion or heartburn.     amLODipine (NORVASC) 10 MG tablet Take 10 mg by mouth at bedtime.     aspirin EC 81 MG tablet Take 81 mg by mouth at bedtime.     cholecalciferol (VITAMIN D3) 25 MCG (1000 UT) tablet Take 1,000 Units by mouth daily.     Cyanocobalamin (VITAMIN B-12) 2500 MCG SUBL Place 2,500 mcg under the tongue every evening.     dexlansoprazole (DEXILANT) 60 MG capsule Take 60 mg by mouth daily.      dicyclomine (BENTYL) 10 MG capsule Take 10 mg by mouth 2 (two) times daily as needed.      ergocalciferol (VITAMIN D2) 1.25 MG (50000 UT) capsule Take 50,000 Units by mouth every Sunday.     guaiFENesin-dextromethorphan (ROBITUSSIN DM) 100-10 MG/5ML syrup Take 10 mLs by mouth every 4 (four) hours as needed for cough.      losartan (COZAAR) 100 MG tablet Take 100 mg by mouth at bedtime.     methylPREDNISolone (MEDROL DOSEPAK) 4 MG TBPK tablet follow package directions (Patient not taking: Reported on 01/10/2023) 21 tablet 0   metoprolol tartrate (LOPRESSOR) 25 MG tablet Take 1 tablet (25 mg total) by mouth 2 (two) times daily. 60 tablet 3   ondansetron (ZOFRAN) 4 MG tablet Take 4 mg by mouth every 6 (six) hours as needed for nausea or vomiting.     pravastatin (PRAVACHOL) 40 MG tablet Take 40 mg by mouth at bedtime.      No current facility-administered medications for this visit.    REVIEW OF SYSTEMS (negative unless checked):   Cardiac:  []  Chest pain or chest pressure? []  Shortness of breath upon activity? []  Shortness of breath when lying flat? []  Irregular heart rhythm?  Vascular:  []  Pain in calf, thigh, or hip brought on by walking? []  Pain in feet at night that wakes you up from your sleep? []  Blood clot in your veins? []  Leg swelling?  Pulmonary:  []  Oxygen at home? []  Productive cough? []  Wheezing?  Neurologic:  []  Sudden weakness in arms or legs? []  Sudden numbness in arms or legs? []  Sudden onset of difficult speaking or slurred speech? []  Temporary loss of vision in one eye? []   Problems with dizziness?  Gastrointestinal:  []  Blood in stool? []  Vomited blood?  Genitourinary:  []  Burning when urinating? []  Blood in urine?  Psychiatric:  []  Major depression  Hematologic:  []  Bleeding problems? []  Problems with blood clotting?  Dermatologic:  []  Rashes or ulcers?  Constitutional:  []  Fever or chills?  Ear/Nose/Throat:  []  Change in hearing? []  Nose bleeds? []  Sore throat?  Musculoskeletal:  []  Back pain? []  Joint pain? []  Muscle pain?   Physical Examination   Vitals:   01/10/23 0922  BP: (!) 144/71  Pulse: (!) 46  Temp: (!) 97.4 F (36.3 C)  SpO2: 95%  Weight: 153 lb 1.6 oz (69.4 kg)  Height: 5\' 8"  (1.727 m)   Body mass index is 23.28  kg/m.  General:  WDWN in NAD; vital signs documented above Gait: Not observed HENT: WNL, normocephalic Pulmonary: normal non-labored breathing , without rales, rhonchi,  wheezing Cardiac: Regular Abdomen: soft, NT, no masses Skin: without rashes Vascular Exam/Pulses: 2+ radial and DP pulses Extremities: without ischemic changes, without gangrene , without cellulitis; without open wounds;  Musculoskeletal: no muscle wasting or atrophy  Neurologic: A&O X 3;  No focal weakness or paresthesias are detected Psychiatric:  The pt has Normal affect.   Non-Invasive Vascular Imaging   AAA Duplex (01/10/2023) Current size: 4.43 cm Previous size: 4.2 cm (11/2021) R CIA: 1.1 cm L CIA: 1.1 cm   Medical Decision Making   Jeffery Payne is a 72 y.o. (02/21/1951) male who presents for surveillance of AAA  Based on this patient's duplex, his AAA is fairly stable in size at 4.43 cm.  Last known measurement was 4.2 cm in September 2023.  This change in growth is less than 0.25 cm in 1 year. The patient has chronic abdominal pain, which has not changed.  He denies any new abdominal pain, back pain, or chest pain. On exam his abdomen is soft and nontender to palpation.  He has palpable pedal pulses The patient will continue to take their aspirin and statin and follow up with our office in 1 year with AAA duplex   Loel Dubonnet PA-C [facility] Office: [phone]  Clinic MD: Randie Heinz

## 2023-01-30 ENCOUNTER — Other Ambulatory Visit: Payer: Self-pay

## 2023-01-30 DIAGNOSIS — I714 Abdominal aortic aneurysm, without rupture, unspecified: Secondary | ICD-10-CM

## 2023-01-31 DIAGNOSIS — H353132 Nonexudative age-related macular degeneration, bilateral, intermediate dry stage: Secondary | ICD-10-CM | POA: Diagnosis not present

## 2023-02-07 DIAGNOSIS — H9193 Unspecified hearing loss, bilateral: Secondary | ICD-10-CM | POA: Diagnosis not present

## 2023-02-07 DIAGNOSIS — F3341 Major depressive disorder, recurrent, in partial remission: Secondary | ICD-10-CM | POA: Diagnosis not present

## 2023-02-07 DIAGNOSIS — I6523 Occlusion and stenosis of bilateral carotid arteries: Secondary | ICD-10-CM | POA: Diagnosis not present

## 2023-02-07 DIAGNOSIS — I10 Essential (primary) hypertension: Secondary | ICD-10-CM | POA: Diagnosis not present

## 2023-02-07 DIAGNOSIS — I739 Peripheral vascular disease, unspecified: Secondary | ICD-10-CM | POA: Diagnosis not present

## 2023-02-07 DIAGNOSIS — K429 Umbilical hernia without obstruction or gangrene: Secondary | ICD-10-CM | POA: Diagnosis not present

## 2023-02-07 DIAGNOSIS — E785 Hyperlipidemia, unspecified: Secondary | ICD-10-CM | POA: Diagnosis not present

## 2023-02-07 DIAGNOSIS — J309 Allergic rhinitis, unspecified: Secondary | ICD-10-CM | POA: Diagnosis not present

## 2023-02-07 DIAGNOSIS — K219 Gastro-esophageal reflux disease without esophagitis: Secondary | ICD-10-CM | POA: Diagnosis not present

## 2023-02-07 DIAGNOSIS — D510 Vitamin B12 deficiency anemia due to intrinsic factor deficiency: Secondary | ICD-10-CM | POA: Diagnosis not present

## 2023-02-07 DIAGNOSIS — H3530 Unspecified macular degeneration: Secondary | ICD-10-CM | POA: Diagnosis not present

## 2023-02-07 DIAGNOSIS — I714 Abdominal aortic aneurysm, without rupture, unspecified: Secondary | ICD-10-CM | POA: Diagnosis not present

## 2023-03-01 DIAGNOSIS — E559 Vitamin D deficiency, unspecified: Secondary | ICD-10-CM | POA: Diagnosis not present

## 2023-03-01 DIAGNOSIS — I714 Abdominal aortic aneurysm, without rupture, unspecified: Secondary | ICD-10-CM | POA: Diagnosis not present

## 2023-03-01 DIAGNOSIS — E7800 Pure hypercholesterolemia, unspecified: Secondary | ICD-10-CM | POA: Diagnosis not present

## 2023-03-01 DIAGNOSIS — I10 Essential (primary) hypertension: Secondary | ICD-10-CM | POA: Diagnosis not present

## 2023-03-01 DIAGNOSIS — E119 Type 2 diabetes mellitus without complications: Secondary | ICD-10-CM | POA: Diagnosis not present

## 2023-03-01 DIAGNOSIS — F439 Reaction to severe stress, unspecified: Secondary | ICD-10-CM | POA: Diagnosis not present

## 2023-03-01 DIAGNOSIS — K589 Irritable bowel syndrome without diarrhea: Secondary | ICD-10-CM | POA: Diagnosis not present

## 2023-03-01 DIAGNOSIS — Z6824 Body mass index (BMI) 24.0-24.9, adult: Secondary | ICD-10-CM | POA: Diagnosis not present

## 2023-03-01 DIAGNOSIS — E538 Deficiency of other specified B group vitamins: Secondary | ICD-10-CM | POA: Diagnosis not present

## 2023-05-17 DIAGNOSIS — H353132 Nonexudative age-related macular degeneration, bilateral, intermediate dry stage: Secondary | ICD-10-CM | POA: Diagnosis not present

## 2023-07-04 DIAGNOSIS — E1159 Type 2 diabetes mellitus with other circulatory complications: Secondary | ICD-10-CM | POA: Diagnosis not present

## 2023-07-04 DIAGNOSIS — M47812 Spondylosis without myelopathy or radiculopathy, cervical region: Secondary | ICD-10-CM | POA: Diagnosis not present

## 2023-07-04 DIAGNOSIS — F439 Reaction to severe stress, unspecified: Secondary | ICD-10-CM | POA: Diagnosis not present

## 2023-07-04 DIAGNOSIS — I714 Abdominal aortic aneurysm, without rupture, unspecified: Secondary | ICD-10-CM | POA: Diagnosis not present

## 2023-07-04 DIAGNOSIS — I152 Hypertension secondary to endocrine disorders: Secondary | ICD-10-CM | POA: Diagnosis not present

## 2023-07-04 DIAGNOSIS — K219 Gastro-esophageal reflux disease without esophagitis: Secondary | ICD-10-CM | POA: Diagnosis not present

## 2023-07-04 DIAGNOSIS — Z6824 Body mass index (BMI) 24.0-24.9, adult: Secondary | ICD-10-CM | POA: Diagnosis not present

## 2023-07-04 DIAGNOSIS — E7800 Pure hypercholesterolemia, unspecified: Secondary | ICD-10-CM | POA: Diagnosis not present

## 2023-07-04 DIAGNOSIS — K589 Irritable bowel syndrome without diarrhea: Secondary | ICD-10-CM | POA: Diagnosis not present

## 2023-11-06 DIAGNOSIS — E538 Deficiency of other specified B group vitamins: Secondary | ICD-10-CM | POA: Diagnosis not present

## 2023-11-06 DIAGNOSIS — Z6824 Body mass index (BMI) 24.0-24.9, adult: Secondary | ICD-10-CM | POA: Diagnosis not present

## 2023-11-06 DIAGNOSIS — K219 Gastro-esophageal reflux disease without esophagitis: Secondary | ICD-10-CM | POA: Diagnosis not present

## 2023-11-06 DIAGNOSIS — E1159 Type 2 diabetes mellitus with other circulatory complications: Secondary | ICD-10-CM | POA: Diagnosis not present

## 2023-11-06 DIAGNOSIS — Z125 Encounter for screening for malignant neoplasm of prostate: Secondary | ICD-10-CM | POA: Diagnosis not present

## 2023-11-06 DIAGNOSIS — E7800 Pure hypercholesterolemia, unspecified: Secondary | ICD-10-CM | POA: Diagnosis not present

## 2023-11-06 DIAGNOSIS — K589 Irritable bowel syndrome without diarrhea: Secondary | ICD-10-CM | POA: Diagnosis not present

## 2023-11-06 DIAGNOSIS — F439 Reaction to severe stress, unspecified: Secondary | ICD-10-CM | POA: Diagnosis not present

## 2023-11-06 DIAGNOSIS — I152 Hypertension secondary to endocrine disorders: Secondary | ICD-10-CM | POA: Diagnosis not present

## 2024-01-01 DIAGNOSIS — Z1331 Encounter for screening for depression: Secondary | ICD-10-CM | POA: Diagnosis not present

## 2024-01-01 DIAGNOSIS — Z9181 History of falling: Secondary | ICD-10-CM | POA: Diagnosis not present

## 2024-01-01 DIAGNOSIS — Z0000 Encounter for general adult medical examination without abnormal findings: Secondary | ICD-10-CM | POA: Diagnosis not present

## 2024-01-08 DIAGNOSIS — E785 Hyperlipidemia, unspecified: Secondary | ICD-10-CM | POA: Diagnosis not present

## 2024-01-08 DIAGNOSIS — K589 Irritable bowel syndrome without diarrhea: Secondary | ICD-10-CM | POA: Diagnosis not present

## 2024-01-08 DIAGNOSIS — I10 Essential (primary) hypertension: Secondary | ICD-10-CM | POA: Diagnosis not present

## 2024-01-08 DIAGNOSIS — I719 Aortic aneurysm of unspecified site, without rupture: Secondary | ICD-10-CM | POA: Diagnosis not present

## 2024-01-08 DIAGNOSIS — K219 Gastro-esophageal reflux disease without esophagitis: Secondary | ICD-10-CM | POA: Diagnosis not present

## 2024-01-08 DIAGNOSIS — R011 Cardiac murmur, unspecified: Secondary | ICD-10-CM | POA: Diagnosis not present

## 2024-01-08 DIAGNOSIS — F3341 Major depressive disorder, recurrent, in partial remission: Secondary | ICD-10-CM | POA: Diagnosis not present

## 2024-01-08 DIAGNOSIS — M1990 Unspecified osteoarthritis, unspecified site: Secondary | ICD-10-CM | POA: Diagnosis not present

## 2024-01-08 DIAGNOSIS — E663 Overweight: Secondary | ICD-10-CM | POA: Diagnosis not present

## 2024-01-08 DIAGNOSIS — J309 Allergic rhinitis, unspecified: Secondary | ICD-10-CM | POA: Diagnosis not present

## 2024-01-08 DIAGNOSIS — K429 Umbilical hernia without obstruction or gangrene: Secondary | ICD-10-CM | POA: Diagnosis not present

## 2024-01-08 DIAGNOSIS — H3530 Unspecified macular degeneration: Secondary | ICD-10-CM | POA: Diagnosis not present

## 2024-01-09 DIAGNOSIS — Z23 Encounter for immunization: Secondary | ICD-10-CM | POA: Diagnosis not present

## 2024-02-12 DIAGNOSIS — H353132 Nonexudative age-related macular degeneration, bilateral, intermediate dry stage: Secondary | ICD-10-CM | POA: Diagnosis not present

## 2024-03-18 ENCOUNTER — Other Ambulatory Visit: Payer: Self-pay

## 2024-03-18 DIAGNOSIS — I714 Abdominal aortic aneurysm, without rupture, unspecified: Secondary | ICD-10-CM

## 2024-04-16 ENCOUNTER — Ambulatory Visit (HOSPITAL_COMMUNITY)

## 2024-07-02 ENCOUNTER — Ambulatory Visit: Payer: Self-pay

## 2024-07-02 ENCOUNTER — Ambulatory Visit (HOSPITAL_COMMUNITY): Payer: Self-pay
